# Patient Record
Sex: Female | Born: 1985 | Race: Black or African American | Hispanic: No | Marital: Single | Smoking: Never smoker
Health system: Southern US, Community
[De-identification: ages and names within clinical notes are randomized; demographics above are authoritative.]

## PROBLEM LIST (undated history)

## (undated) HISTORY — PX: TUBAL LIGATION: SHX77

---

## 2013-07-05 ENCOUNTER — Emergency Department (HOSPITAL_BASED_OUTPATIENT_CLINIC_OR_DEPARTMENT_OTHER): Payer: Worker's Compensation

## 2013-07-05 ENCOUNTER — Emergency Department (HOSPITAL_BASED_OUTPATIENT_CLINIC_OR_DEPARTMENT_OTHER)
Admission: EM | Admit: 2013-07-05 | Discharge: 2013-07-05 | Disposition: A | Discharge status: Home / Self Care | Payer: Worker's Compensation | Attending: Emergency Medicine | Admitting: Emergency Medicine

## 2013-07-05 ENCOUNTER — Encounter (HOSPITAL_BASED_OUTPATIENT_CLINIC_OR_DEPARTMENT_OTHER): Payer: Self-pay | Admitting: Emergency Medicine

## 2013-07-05 DIAGNOSIS — X500XXA Overexertion from strenuous movement or load, initial encounter: Secondary | ICD-10-CM | POA: Diagnosis not present

## 2013-07-05 DIAGNOSIS — IMO0002 Reserved for concepts with insufficient information to code with codable children: Secondary | ICD-10-CM | POA: Insufficient documentation

## 2013-07-05 DIAGNOSIS — Z3202 Encounter for pregnancy test, result negative: Secondary | ICD-10-CM | POA: Diagnosis not present

## 2013-07-05 DIAGNOSIS — S99919A Unspecified injury of unspecified ankle, initial encounter: Secondary | ICD-10-CM | POA: Diagnosis present

## 2013-07-05 DIAGNOSIS — S8990XA Unspecified injury of unspecified lower leg, initial encounter: Secondary | ICD-10-CM | POA: Diagnosis present

## 2013-07-05 DIAGNOSIS — Y9389 Activity, other specified: Secondary | ICD-10-CM | POA: Diagnosis not present

## 2013-07-05 DIAGNOSIS — S8392XA Sprain of unspecified site of left knee, initial encounter: Secondary | ICD-10-CM

## 2013-07-05 DIAGNOSIS — Y929 Unspecified place or not applicable: Secondary | ICD-10-CM | POA: Insufficient documentation

## 2013-07-05 DIAGNOSIS — Y990 Civilian activity done for income or pay: Secondary | ICD-10-CM | POA: Insufficient documentation

## 2013-07-05 LAB — PREGNANCY, URINE: Preg Test, Ur: NEGATIVE

## 2013-07-05 MED ORDER — IBUPROFEN 800 MG PO TABS
800.0000 mg | ORAL_TABLET | Freq: Once | ORAL | Status: AC
  Administered 2013-07-05: 800 mg via ORAL
  Filled 2013-07-05: qty 1

## 2013-07-05 NOTE — ED Notes (Signed)
Patient transported to X-ray 
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
## 2013-07-05 NOTE — ED Notes (Signed)
MD at bedside. 

## 2013-07-05 NOTE — ED Notes (Signed)
Patient asked to change into gown. 

## 2013-07-05 NOTE — Discharge Instructions (Signed)
Knee Sprain  A knee sprain is a tear in one of the strong, fibrous tissues that connect the bones (ligaments) in your knee. The severity of the sprain depends on how much of the ligament is torn. The tear can be either partial or complete.  CAUSES   Often, sprains are a result of a fall or injury. The force of the impact causes the fibers of your ligament to stretch too much. This excess tension causes the fibers of your ligament to tear.  SIGNS AND SYMPTOMS   You may have some loss of motion in your knee. Other symptoms include:   Bruising.   Pain in the knee area.   Tenderness of the knee to the touch.   Swelling.  DIAGNOSIS   To diagnose a knee sprain, your health care provider will physically examine your knee. Your health care provider may also suggest an X-Jash Wahlen exam of your knee to make sure no bones are broken.  TREATMENT   If your ligament is only partially torn, treatment usually involves keeping the knee in a fixed position (immobilization) or bracing your knee for activities that require movement for several weeks. To do this, your health care provider will apply a bandage, cast, or splint to keep your knee from moving and to support your knee during movement until it heals. For a partially torn ligament, the healing process usually takes 4 6 weeks.  If your ligament is completely torn, depending on which ligament it is, you may need surgery to reconnect the ligament to the bone or reconstruct it. After surgery, a cast or splint may be applied and will need to stay on your knee for 4 6 weeks while your ligament heals.  HOME CARE INSTRUCTIONS   Keep your injured knee elevated to decrease swelling.   To ease pain and swelling, apply ice to the injured area:   Put ice in a plastic bag.   Place a towel between your skin and the bag.   Leave the ice on for 20 minutes, 2 3 times a day.   Only take medicine for pain as directed by your health care provider.   Do not leave your knee unprotected until  pain and stiffness go away (usually 4 6 weeks).   If you have a cast or splint, do not allow it to get wet. If you have been instructed not to remove it, cover it with a plastic bag when you shower or bathe. Do not swim.   Your health care provider may suggest exercises for you to do during your recovery to prevent or limit permanent weakness and stiffness.  SEEK IMMEDIATE MEDICAL CARE IF:   Your cast or splint becomes damaged.   Your pain becomes worse.   You have significant pain, swelling, or numbness below the cast or splint.  MAKE SURE YOU:   Understand these instructions.   Will watch your condition.   Will get help right away if you are not doing well or get worse.  Document Released: 03/03/2005 Document Revised: 12/22/2012 Document Reviewed: 10/13/2012  [employer] Patient Information 2014 [employer].

## 2015-11-20 ENCOUNTER — Emergency Department (HOSPITAL_COMMUNITY)
Admission: EM | Admit: 2015-11-20 | Discharge: 2015-11-20 | Disposition: A | Discharge status: Home / Self Care | Payer: Medicaid Other | Attending: Emergency Medicine | Admitting: Emergency Medicine

## 2015-11-20 ENCOUNTER — Encounter (HOSPITAL_COMMUNITY): Payer: Self-pay | Admitting: Emergency Medicine

## 2015-11-20 DIAGNOSIS — R0789 Other chest pain: Secondary | ICD-10-CM | POA: Diagnosis not present

## 2015-11-20 DIAGNOSIS — F419 Anxiety disorder, unspecified: Secondary | ICD-10-CM

## 2015-11-20 DIAGNOSIS — R079 Chest pain, unspecified: Secondary | ICD-10-CM | POA: Diagnosis present

## 2015-11-20 LAB — CBC
HCT: 40.9 % (ref 36.0–46.0)
Hemoglobin: 13.1 g/dL (ref 12.0–15.0)
MCH: 26.1 pg (ref 26.0–34.0)
MCHC: 32 g/dL (ref 30.0–36.0)
MCV: 81.5 fL (ref 78.0–100.0)
Platelets: 160 10*3/uL (ref 150–400)
RBC: 5.02 MIL/uL (ref 3.87–5.11)
RDW: 14.7 % (ref 11.5–15.5)
WBC: 6.8 10*3/uL (ref 4.0–10.5)

## 2015-11-20 LAB — BASIC METABOLIC PANEL
Anion gap: 7 (ref 5–15)
BUN: 7 mg/dL (ref 6–20)
CO2: 20 mmol/L — ABNORMAL LOW (ref 22–32)
Calcium: 8.5 mg/dL — ABNORMAL LOW (ref 8.9–10.3)
Chloride: 110 mmol/L (ref 101–111)
Creatinine, Ser: 0.66 mg/dL (ref 0.44–1.00)
GFR calc Af Amer: >60 mL/min (ref >60)
GFR calc non Af Amer: >60 mL/min (ref >60)
Glucose, Bld: 99 mg/dL (ref 65–99)
Potassium: 3.7 mmol/L (ref 3.5–5.1)
Sodium: 137 mmol/L (ref 135–145)

## 2015-11-20 LAB — I-STAT BETA HCG BLOOD, ED (MC, WL, AP ONLY): I-stat hCG, quantitative: <5 m[IU]/mL (ref <5)

## 2015-11-20 MED ORDER — LORAZEPAM 2 MG/ML IJ SOLN
1.0000 mg | Freq: Once | INTRAMUSCULAR | Status: AC
  Administered 2015-11-20: 1 mg via INTRAVENOUS
  Filled 2015-11-20: qty 1

## 2015-11-20 MED ORDER — SODIUM CHLORIDE 0.9 % IV BOLUS (SEPSIS)
1000.0000 mL | Freq: Once | INTRAVENOUS | Status: AC
  Administered 2015-11-20: 1000 mL via INTRAVENOUS

## 2015-11-20 NOTE — Discharge Instructions (Signed)
Please rest for the remainder of the day. Return to the ED if your symptoms are worsening.
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
## 2015-11-20 NOTE — ED Triage Notes (Signed)
Per [facility] patient complains of squeezing chest pain that started at approximately 1445 today while riding to work.  Patient states this happened once before several years ago and it was related to anxiety and stress, but denies any significant stress factors at this time.  Patient received 324 asa and 1 SL nitro en route, patient states the nitro made her pain worse.  Patient is alert and oriented at this time.

## 2015-11-21 ENCOUNTER — Telehealth (HOSPITAL_BASED_OUTPATIENT_CLINIC_OR_DEPARTMENT_OTHER): Payer: Self-pay | Admitting: Emergency Medicine

## 2016-02-12 ENCOUNTER — Emergency Department (HOSPITAL_BASED_OUTPATIENT_CLINIC_OR_DEPARTMENT_OTHER)
Admission: EM | Admit: 2016-02-12 | Discharge: 2016-02-12 | Disposition: A | Discharge status: Home / Self Care | Payer: Medicaid Other | Attending: Emergency Medicine | Admitting: Emergency Medicine

## 2016-02-12 ENCOUNTER — Encounter (HOSPITAL_BASED_OUTPATIENT_CLINIC_OR_DEPARTMENT_OTHER): Payer: Self-pay | Admitting: *Deleted

## 2016-02-12 DIAGNOSIS — M545 Low back pain, unspecified: Secondary | ICD-10-CM

## 2016-02-12 MED ORDER — METHOCARBAMOL 500 MG PO TABS
1000.0000 mg | ORAL_TABLET | Freq: Once | ORAL | Status: AC
  Administered 2016-02-12: 1000 mg via ORAL
  Filled 2016-02-12: qty 2

## 2016-02-12 MED ORDER — OXYCODONE-ACETAMINOPHEN 5-325 MG PO TABS
1.0000 | ORAL_TABLET | Freq: Once | ORAL | Status: AC
  Administered 2016-02-12: 1 via ORAL
  Filled 2016-02-12: qty 1

## 2016-02-12 MED ORDER — METHOCARBAMOL 500 MG PO TABS: 500.0000 mg | ORAL_TABLET | Freq: Three times a day (TID) | ORAL | 0 refills | Status: DC | PRN

## 2016-02-12 MED ORDER — KETOROLAC TROMETHAMINE 60 MG/2ML IM SOLN
60.0000 mg | Freq: Once | INTRAMUSCULAR | Status: AC
  Administered 2016-02-12: 60 mg via INTRAMUSCULAR
  Filled 2016-02-12: qty 2

## 2016-02-12 MED ORDER — IBUPROFEN 400 MG PO TABS: 400.0000 mg | ORAL_TABLET | Freq: Three times a day (TID) | ORAL | 0 refills | Status: DC | PRN

## 2016-02-12 NOTE — ED Provider Notes (Addendum)
[facility]   CSN: [id number] Arrival date & time: 02/12/16  1252     History   Chief Complaint Chief Complaint  Patient presents with  . Back Pain    HPI Audrey Armstrong is a 30 y.o. female.  The history is provided by the patient.   Patient presents to the emergency department with complaints of worsening right low back pain today.  She did move a heavy TV several days ago but did not have immediate pain at that time.  She woke with severe pain in her right low back and stated difficulty getting out of bed secondary to the severe pain.  No nausea vomiting.  She states even rolling over causes her pain to be much worse.  No trauma.  No recent falls.  No fevers or chills.  No dysuria or urinary frequency.   History reviewed. No pertinent past medical history.  There are no active problems to display for this patient.   Past Surgical History:  Procedure Laterality Date  . TUBAL LIGATION      OB History    No data available       Home Medications    Prior to Admission medications   Medication Sig Start Date End Date Taking? Authorizing Provider  acetaminophen (TYLENOL) 325 MG tablet Take 650 mg by mouth every 6 (six) hours as needed.    Historical Provider, MD  ibuprofen (ADVIL,MOTRIN) 400 MG tablet Take 1 tablet (400 mg total) by mouth every 8 (eight) hours as needed. 02/12/16   Azalia BilisKevin Floella Ensz, MD  methocarbamol (ROBAXIN) 500 MG tablet Take 1 tablet (500 mg total) by mouth every 8 (eight) hours as needed for muscle spasms. 02/12/16   Azalia BilisKevin Cassundra Mckeever, MD    Family History No family history on file.  Social History Social History  Substance Use Topics  . Smoking status: Never Smoker  . Smokeless tobacco: Never Used  . Alcohol use No     Allergies   Patient has no known allergies.   Review of Systems Review of Systems  All other systems reviewed and are negative.    Physical Exam Updated Vital Signs BP 113/60 (BP Location:  Right Arm)   Pulse 78   Temp 98.4 F (36.9 C) (Oral)   Resp 18   Ht 5\' 5"  (1.651 m)   Wt 230 lb (104.3 kg)   LMP 12/16/2015 (Approximate)   SpO2 100%   BMI 38.27 kg/m   Physical Exam  Constitutional: She is oriented to person, place, and time. She appears well-developed and well-nourished.  HENT:  Head: Normocephalic.  Eyes: EOM are normal.  Neck: Normal range of motion.  Pulmonary/Chest: Effort normal.  Abdominal: She exhibits no distension.  Musculoskeletal: Normal range of motion.  No thoracic or lumbar point tenderness.  Right paralumbar tenderness and spasm noted.  No rash present  Neurological: She is alert and oriented to person, place, and time.  Full strength in bilateral lower extremity major muscle groups  Psychiatric: She has a normal mood and affect.  Nursing note and vitals reviewed.    ED Treatments / Results  Labs (all labs ordered are listed, but only abnormal results are displayed) Labs Reviewed - No data to display  EKG  EKG Interpretation None       Radiology No results found.  Procedures Procedures (including critical care time)  Medications Ordered in ED Medications  ketorolac (TORADOL) injection 60 mg (not administered)  methocarbamol (ROBAXIN) tablet 1,000 mg (not administered)  oxyCODONE-acetaminophen (PERCOCET/ROXICET) 5-325 MG per tablet 1 tablet (not administered)     Initial Impression / Assessment and Plan / ED Course  I have reviewed the triage vital signs and the nursing notes.  Pertinent labs & imaging results that were available during my care of the patient were reviewed by me and considered in my medical decision making (see chart for details).  Clinical Course     Normal lower extremity neurologic exam. No bowel or bladder complaints. No back pain red flags. Likely musculoskeletal back pain. Doubt spinal epidural abscess. Doubt cauda equina. Doubt pyelonephritis  Final Clinical Impressions(s) / ED Diagnoses    Final diagnoses:  Acute right-sided low back pain without sciatica    New Prescriptions New Prescriptions   IBUPROFEN (ADVIL,MOTRIN) 400 MG TABLET    Take 1 tablet (400 mg total) by mouth every 8 (eight) hours as needed.   METHOCARBAMOL (ROBAXIN) 500 MG TABLET    Take 1 tablet (500 mg total) by mouth every 8 (eight) hours as needed for muscle spasms.     Azalia BilisKevin Yari Szeliga, MD 02/12/16 1332    Azalia BilisKevin Senica Crall, MD 02/12/16 1332

## 2016-02-12 NOTE — ED Notes (Signed)
ED Provider at bedside. 

## 2016-02-12 NOTE — ED Triage Notes (Signed)
Per EMS report. Pt c/o back pain after moving large TV on Saturday. Pain worse today. VS: 142/90, HR 94, RR 22, 97% RA, Pain 10/10

## 2016-08-16 ENCOUNTER — Encounter (HOSPITAL_BASED_OUTPATIENT_CLINIC_OR_DEPARTMENT_OTHER): Payer: Self-pay | Admitting: *Deleted

## 2016-08-16 ENCOUNTER — Emergency Department (HOSPITAL_BASED_OUTPATIENT_CLINIC_OR_DEPARTMENT_OTHER)
Admission: EM | Admit: 2016-08-16 | Discharge: 2016-08-16 | Disposition: A | Discharge status: Home / Self Care | Payer: Medicaid Other | Attending: Emergency Medicine | Admitting: Emergency Medicine

## 2016-08-16 DIAGNOSIS — Z79899 Other long term (current) drug therapy: Secondary | ICD-10-CM | POA: Insufficient documentation

## 2016-08-16 DIAGNOSIS — J069 Acute upper respiratory infection, unspecified: Secondary | ICD-10-CM | POA: Insufficient documentation

## 2016-08-16 LAB — RAPID STREP SCREEN (MED CTR MEBANE ONLY): Streptococcus, Group A Screen (Direct): NEGATIVE

## 2016-08-16 NOTE — ED Triage Notes (Signed)
Pt reports nasal congestion, headache, L facial pain and sore throat since last night. Denies pain meds PTA.

## 2016-08-16 NOTE — ED Notes (Signed)
ED Provider at bedside. 

## 2016-08-16 NOTE — ED Provider Notes (Signed)
[facility]   CSN: [id number] Arrival date & time: 08/16/16  1329     History   Chief Complaint Chief Complaint  Patient presents with  . Nasal Congestion    HPI Audrey Armstrong is a 31 y.o. female.  HPI  Pt presenting with c/o nasal congestion, sore throat which started last night.  She also feels a sore in the left side of her mouth that is uncomfortable.  No fever.  No signfiicant cough.  She has a child at home with similar symptoms.  She has not had any treatment prior to arrival.  No difficulty breathing or swallowing, no vomiting or diarrhea.  There are no other associated systemic symptoms, there are no other alleviating or modifying factors.   History reviewed. No pertinent past medical history.  There are no active problems to display for this patient.   Past Surgical History:  Procedure Laterality Date  . TUBAL LIGATION      OB History    No data available       Home Medications    Prior to Admission medications   Medication Sig Start Date End Date Taking? Authorizing Provider  acetaminophen (TYLENOL) 325 MG tablet Take 650 mg by mouth every 6 (six) hours as needed.    [provider]  ibuprofen (ADVIL,MOTRIN) 400 MG tablet Take 1 tablet (400 mg total) by mouth every 8 (eight) hours as needed. 02/12/16   Azalia Bilisampos, Kevin, MD  methocarbamol (ROBAXIN) 500 MG tablet Take 1 tablet (500 mg total) by mouth every 8 (eight) hours as needed for muscle spasms. 02/12/16   Azalia Bilisampos, Kevin, MD    Family History No family history on file.  Social History Social History  Substance Use Topics  . Smoking status: Never Smoker  . Smokeless tobacco: Never Used  . Alcohol use No     Allergies   Patient has no known allergies.   Review of Systems Review of Systems  ROS reviewed and all otherwise negative except for mentioned in HPI   Physical Exam Updated Vital Signs BP (!) 105/54 (BP Location: Right Arm)   Pulse 68   Temp  98.3 F (36.8 C) (Oral)   Resp 16   Ht 5\' 5"  (1.651 m)   Wt 104.3 kg (230 lb)   LMP 08/13/2016 (Exact Date)   SpO2 100%   BMI 38.27 kg/m  Vitals reviewed Physical Exam Physical Examination: General appearance - alert, well appearing, and in no distress Mental status - alert, oriented to person, place, and time Eyes - no conjunctival injection, no scleral icterus Mouth - mucous membranes moist, pharynx normal without lesions, small aphthous ulcer on left buccal mucosa, mild erythema of OP, no exudate, palate symmetric, uvula midline Neck - supple, no significant adenopathy Chest - clear to auscultation, no wheezes, rales or rhonchi, symmetric air entry Heart - normal rate, regular rhythm, normal S1, S2, no murmurs, rubs, clicks or gallops Abdomen - soft, nontender, nondistended, no masses or organomegaly Neurological - alert, oriented, normal speech Extremities - peripheral pulses normal, no pedal edema, no clubbing or cyanosis Skin - normal coloration and turgor, no rashes  ED Treatments / Results  Labs (all labs ordered are listed, but only abnormal results are displayed) Labs Reviewed  [facility]APID ST[facility]EN [facility])  CULTURE, GROUP A STREP [facility]    EKG  EKG Interpretation None       Radiology No results found.  Procedures Procedures (including critical care time)  Medications Ordered in  ED Medications - No data to display   Initial Impression / Assessment and Plan / ED Course  I have reviewed the triage vital signs and the nursing notes.  Pertinent labs & imaging results that were available during my care of the patient were reviewed by me and considered in my medical decision making (see chart for details).    Pt presenting with c/o sore throat, nasal congestion.  Rapid strep is negative.   Patient is overall nontoxic and well hydrated in appearance.  Advised symptomatic care, push fluids.  Most c/w viral illness.  No signs of PTA, RP abscess, doubt  pneumonia.  Discharged with strict return precautions.  Pt agreeable with plan.   Final Clinical Impressions(s) / ED Diagnoses   Final diagnoses:  Viral URI    New Prescriptions Discharge Medication List as of 08/16/2016  3:44 PM       Jerelyn Scott, MD 08/17/16 [phone]

## 2016-08-16 NOTE — Discharge Instructions (Signed)
Return to the ED with any concerns including difficulty breathing, vomiting and not able to keep down liquids, decreased urine output, decreased level of alertness/lethargy, or any other alarming symptoms  °

## 2016-08-19 LAB — CULTURE, GROUP A STREP (THRC)

## 2016-10-03 ENCOUNTER — Emergency Department (HOSPITAL_BASED_OUTPATIENT_CLINIC_OR_DEPARTMENT_OTHER)
Admission: EM | Admit: 2016-10-03 | Discharge: 2016-10-03 | Disposition: A | Discharge status: Home / Self Care | Payer: Medicaid Other | Attending: Emergency Medicine | Admitting: Emergency Medicine

## 2016-10-03 ENCOUNTER — Encounter (HOSPITAL_BASED_OUTPATIENT_CLINIC_OR_DEPARTMENT_OTHER): Payer: Self-pay | Admitting: *Deleted

## 2016-10-03 DIAGNOSIS — R112 Nausea with vomiting, unspecified: Secondary | ICD-10-CM | POA: Insufficient documentation

## 2016-10-03 DIAGNOSIS — A599 Trichomoniasis, unspecified: Secondary | ICD-10-CM | POA: Insufficient documentation

## 2016-10-03 DIAGNOSIS — R197 Diarrhea, unspecified: Secondary | ICD-10-CM

## 2016-10-03 LAB — BASIC METABOLIC PANEL
Anion gap: 7 (ref 5–15)
BUN: 8 mg/dL (ref 6–20)
CO2: 27 mmol/L (ref 22–32)
Calcium: 8.4 mg/dL — ABNORMAL LOW (ref 8.9–10.3)
Chloride: 101 mmol/L (ref 101–111)
Creatinine, Ser: 0.82 mg/dL (ref 0.44–1.00)
GFR calc Af Amer: >60 mL/min (ref >60)
GFR calc non Af Amer: >60 mL/min (ref >60)
Glucose, Bld: 89 mg/dL (ref 65–99)
Potassium: 3.6 mmol/L (ref 3.5–5.1)
Sodium: 135 mmol/L (ref 135–145)

## 2016-10-03 LAB — CBC
HCT: 40.7 % (ref 36.0–46.0)
Hemoglobin: 13.4 g/dL (ref 12.0–15.0)
MCH: 27.2 pg (ref 26.0–34.0)
MCHC: 32.9 g/dL (ref 30.0–36.0)
MCV: 82.6 fL (ref 78.0–100.0)
Platelets: 141 10*3/uL — ABNORMAL LOW (ref 150–400)
RBC: 4.93 MIL/uL (ref 3.87–5.11)
RDW: 14.6 % (ref 11.5–15.5)
WBC: 8.3 10*3/uL (ref 4.0–10.5)

## 2016-10-03 LAB — PREGNANCY, URINE: Preg Test, Ur: NEGATIVE

## 2016-10-03 LAB — URINALYSIS, ROUTINE W REFLEX MICROSCOPIC
Bilirubin Urine: NEGATIVE
Glucose, UA: NEGATIVE mg/dL
Ketones, ur: 15 mg/dL — AB
Nitrite: NEGATIVE
Protein, ur: NEGATIVE mg/dL
Specific Gravity, Urine: 1.029 (ref 1.005–1.030)
pH: 5 (ref 5.0–8.0)

## 2016-10-03 LAB — URINALYSIS, MICROSCOPIC (REFLEX)

## 2016-10-03 MED ORDER — ONDANSETRON 4 MG PO TBDP: 4.0000 mg | ORAL_TABLET | Freq: Three times a day (TID) | ORAL | 0 refills | Status: DC | PRN

## 2016-10-03 MED ORDER — ONDANSETRON 4 MG PO TBDP
4.0000 mg | ORAL_TABLET | Freq: Once | ORAL | Status: AC
  Administered 2016-10-03: 4 mg via ORAL
  Filled 2016-10-03: qty 1

## 2016-10-03 MED ORDER — ONDANSETRON HCL 4 MG/2ML IJ SOLN: 4.0000 mg | Freq: Once | INTRAMUSCULAR | Status: DC

## 2016-10-03 MED ORDER — METRONIDAZOLE 500 MG PO TABS
2000.0000 mg | ORAL_TABLET | Freq: Once | ORAL | Status: AC
  Administered 2016-10-03: 2000 mg via ORAL
  Filled 2016-10-03: qty 4

## 2016-10-03 MED ORDER — SODIUM CHLORIDE 0.9 % IV BOLUS (SEPSIS)
1000.0000 mL | Freq: Once | INTRAVENOUS | Status: AC
  Administered 2016-10-03: 1000 mL via INTRAVENOUS

## 2016-10-03 NOTE — ED Notes (Signed)
Pt verbalizes understanding of d/c instructions and denies any further needs at this time. 

## 2016-10-03 NOTE — ED Notes (Signed)
Patient given zofran for PO challenge

## 2016-10-03 NOTE — ED Provider Notes (Signed)
[facility] Provider Note   CSN: [id number] Arrival date & time: 10/03/16  1755 By signing my name below, I, Levon Hedger, attest that this documentation has been prepared under the direction and in the presence of Jerelyn Scott, MD . Electronically Signed: Levon Hedger, Scribe. 10/03/2016. 7:10 PM.   History   Chief Complaint Chief Complaint  Patient presents with  . Emesis   HPI Audrey Armstrong is a 31 y.o. female who presents to the Emergency Department complaining of intermittent non-bloody, non-bilious emesis onset yesterday. Pt reports having 4 episodes of vomiting today. She reports associated nausea, diarrhea and sharp RUQ abdominal pain. Pt has tried to eat ginger ale and crackers today but is unable to keep anything down. No OTC treatments tried for these symptoms PTA. No known sick contacts. No recent travel. She denies any fever and has no other complaints at this time.   The history is provided by the patient. No language interpreter was used.   History reviewed. No pertinent past medical history.  There are no active problems to display for this patient.  Past Surgical History:  Procedure Laterality Date  . TUBAL LIGATION      OB History    No data available      Home Medications    Prior to Admission medications   Medication Sig Start Date End Date Taking? Authorizing Provider  acetaminophen (TYLENOL) 325 MG tablet Take 650 mg by mouth every 6 (six) hours as needed.    [provider]  ibuprofen (ADVIL,MOTRIN) 400 MG tablet Take 1 tablet (400 mg total) by mouth every 8 (eight) hours as needed. 02/12/16   Azalia Bilis, MD  ondansetron (ZOFRAN ODT) 4 MG disintegrating tablet Take 1 tablet (4 mg total) by mouth every 8 (eight) hours as needed for nausea or vomiting. 10/03/16   Jerelyn Scott, MD    Family History History reviewed. No pertinent family history.  Social History Social History  Substance Use Topics  . Smoking status:  Never Smoker  . Smokeless tobacco: Never Used  . Alcohol use No    Allergies   Patient has no known allergies.   Review of Systems Review of Systems  Constitutional: Negative for fever.  Gastrointestinal: Positive for abdominal pain, diarrhea, nausea and vomiting.  All other systems reviewed and are negative.  Physical Exam Updated Vital Signs BP 127/71 (BP Location: Left Arm)   Pulse 80   Temp 98.7 F (37.1 C) (Oral)   Resp 18   Ht 5\' 5"  (1.651 m)   Wt 104.3 kg (230 lb)   LMP 09/09/2016   SpO2 100%   BMI 38.27 kg/m  Vitals reviewed Physical Exam  Physical Examination: General appearance - alert, well appearing, and in no distress Mental status - alert, oriented to person, place, and time Eyes - no conjunctival injection, no scleral icterus Mouth - mucous membranes moist, pharynx normal without lesions Neck - supple, no significant adenopathy Chest - clear to auscultation, no wheezes, rales or rhonchi, symmetric air entry Heart - normal rate, regular rhythm, normal S1, S2, no murmurs, rubs, clicks or gallops Abdomen - soft, mild epigastric tenderness, no gaurding or rebound tenderness, no lower abdominal tenderness to palpation, nondistended, no masses or organomegaly Neurological - alert, oriented, normal speech Extremities - peripheral pulses normal, no pedal edema, no clubbing or cyanosis Skin - normal coloration and turgor, no rashes  ED Treatments / Results  DIAGNOSTIC STUDIES:  Oxygen Saturation is 100% on RA, normal by my interpretation.  COORDINATION OF CARE:  7:12 PM Discussed treatment plan which includes Zofran and IV fluids with pt at bedside and pt agreed to plan.   Labs (all labs ordered are listed, but only abnormal results are displayed) Labs Reviewed  URINALYSIS, ROUTINE W REFLEX MICROSCOPIC - Abnormal; Notable for the following:       Result Value   APPearance CLOUDY (*)    Hgb urine dipstick LARGE (*)    Ketones, ur 15 (*)    Leukocytes,  UA LARGE (*)    All other components within normal limits  URINALYSIS, MICROSCOPIC (REFLEX) - Abnormal; Notable for the following:    Bacteria, UA MANY (*)    Squamous Epithelial / LPF 6-30 (*)    All other components within normal limits  CBC - Abnormal; Notable for the following:    Platelets 141 (*)    All other components within normal limits  BASIC METABOLIC PANEL - Abnormal; Notable for the following:    Calcium 8.4 (*)    All other components within normal limits  PREGNANCY, URINE  GC/CHLAMYDIA PROBE AMP ([place]    EKG  EKG Interpretation None       Radiology No results found.  Procedures Procedures (including critical care time)  Medications Ordered in ED Medications  sodium chloride 0.9 % bolus 1,000 mL (0 mLs Intravenous Stopped 10/03/16 2039)  ondansetron (ZOFRAN-ODT) disintegrating tablet 4 mg (4 mg Oral Given 10/03/16 1919)  metroNIDAZOLE (FLAGYL) tablet 2,000 mg (2,000 mg Oral Given 10/03/16 2038)     Initial Impression / Assessment and Plan / ED Course  I have reviewed the triage vital signs and the nursing notes.  Pertinent labs & imaging results that were available during my care of the patient were reviewed by me and considered in my medical decision making (see chart for details).     Pt presenting with c/o vomtiing and diarrhea.  Abdominal exam is benign without significant tenderness.  Labs are reassuring. Pt feels improved after zofran and is able to tolerate po fluids.  Pt does have trichomonas in urine- treated with po flagyl.  No abdominal tendereness to suggest PID,  Urine sent for gc/chlamydia as well.  Pt counseled to alert sexual partners that they should be treated.  Discharged with strict return precautions.  Pt agreeable with plan.  Final Clinical Impressions(s) / ED Diagnoses   Final diagnoses:  Nausea vomiting and diarrhea  Trichomonas infection    New Prescriptions Discharge Medication List as of 10/03/2016  8:50  PM    START taking these medications   Details  ondansetron (ZOFRAN ODT) 4 MG disintegrating tablet Take 1 tablet (4 mg total) by mouth every 8 (eight) hours as needed for nausea or vomiting., Starting Fri 10/03/2016, Print       I personally performed the services described in this documentation, which was scribed in my presence. The recorded information has been reviewed and is accurate.     Jerelyn ScottLinker, Martha, MD 10/04/16 [id number]

## 2016-10-03 NOTE — ED Triage Notes (Signed)
Pt c/o n/v/d and dizziness x 2 days

## 2016-10-03 NOTE — ED Notes (Signed)
Patient reports relief from nausea

## 2016-10-03 NOTE — Discharge Instructions (Signed)
Return to the ED with any concerns including vomiting and not able to keep down liquids or your medications, abdominal pain especially if it localizes to the right lower abdomen, fever or chills, and decreased urine output, decreased level of alertness or lethargy, or any other alarming symptoms.   It is very important that any sexual partners are notified and treated for trichomonas as well

## 2016-10-06 LAB — GC/CHLAMYDIA PROBE AMP (~~LOC~~) NOT AT ARMC

## 2017-07-14 ENCOUNTER — Other Ambulatory Visit: Payer: Self-pay

## 2017-07-14 ENCOUNTER — Emergency Department (HOSPITAL_BASED_OUTPATIENT_CLINIC_OR_DEPARTMENT_OTHER)
Admission: EM | Admit: 2017-07-14 | Discharge: 2017-07-14 | Disposition: A | Discharge status: Home / Self Care | Payer: Self-pay | Attending: Emergency Medicine | Admitting: Emergency Medicine

## 2017-07-14 ENCOUNTER — Encounter (HOSPITAL_BASED_OUTPATIENT_CLINIC_OR_DEPARTMENT_OTHER): Payer: Self-pay | Admitting: Emergency Medicine

## 2017-07-14 DIAGNOSIS — Z5321 Procedure and treatment not carried out due to patient leaving prior to being seen by health care provider: Secondary | ICD-10-CM | POA: Insufficient documentation

## 2017-07-14 DIAGNOSIS — M549 Dorsalgia, unspecified: Secondary | ICD-10-CM | POA: Insufficient documentation

## 2017-07-14 DIAGNOSIS — R1030 Lower abdominal pain, unspecified: Secondary | ICD-10-CM | POA: Insufficient documentation

## 2017-07-14 LAB — URINALYSIS, ROUTINE W REFLEX MICROSCOPIC
Bilirubin Urine: NEGATIVE
Glucose, UA: NEGATIVE mg/dL
Ketones, ur: NEGATIVE mg/dL
Nitrite: NEGATIVE
Protein, ur: NEGATIVE mg/dL
Specific Gravity, Urine: >1.03 — ABNORMAL HIGH (ref 1.005–1.030)
pH: 5.5 (ref 5.0–8.0)

## 2017-07-14 LAB — URINALYSIS, MICROSCOPIC (REFLEX)

## 2017-07-14 LAB — PREGNANCY, URINE: Preg Test, Ur: NEGATIVE

## 2017-07-14 NOTE — ED Notes (Signed)
Pt informed registration that she had another appointment that she had to go to and was leaving.

## 2017-07-14 NOTE — ED Triage Notes (Signed)
Pt c/o lower abd and back pain; sts she was just treated for a "kidney infection" over Easter.

## 2019-03-02 ENCOUNTER — Emergency Department (HOSPITAL_COMMUNITY)
Admission: EM | Admit: 2019-03-02 | Discharge: 2019-03-02 | Disposition: A | Discharge status: Home / Self Care | Payer: Medicaid Other | Attending: Emergency Medicine | Admitting: Emergency Medicine

## 2019-03-02 ENCOUNTER — Other Ambulatory Visit: Payer: Self-pay

## 2019-03-02 ENCOUNTER — Encounter (HOSPITAL_COMMUNITY): Payer: Self-pay

## 2019-03-02 DIAGNOSIS — U071 COVID-19: Secondary | ICD-10-CM | POA: Diagnosis not present

## 2019-03-02 DIAGNOSIS — R432 Parageusia: Secondary | ICD-10-CM

## 2019-03-02 DIAGNOSIS — R05 Cough: Secondary | ICD-10-CM | POA: Diagnosis present

## 2019-03-02 DIAGNOSIS — R059 Cough, unspecified: Secondary | ICD-10-CM

## 2019-03-02 LAB — SARS CORONAVIRUS 2 (TAT 6-24 HRS): SARS Coronavirus 2: POSITIVE — AB

## 2019-03-02 MED ORDER — ACETAMINOPHEN 500 MG PO TABS: 500.0000 mg | ORAL_TABLET | Freq: Four times a day (QID) | ORAL | 0 refills | Status: DC | PRN

## 2019-03-02 MED ORDER — ACETAMINOPHEN 500 MG PO TABS: 500.0000 mg | ORAL_TABLET | Freq: Four times a day (QID) | ORAL | 0 refills | Status: AC | PRN

## 2019-03-02 MED ORDER — NAPROXEN 500 MG PO TABS: 500.0000 mg | ORAL_TABLET | Freq: Two times a day (BID) | ORAL | 0 refills | Status: DC | PRN

## 2019-03-02 NOTE — ED Provider Notes (Signed)
[facility] DEPT Provider Note   CSN: [id number] Arrival date & time: 03/02/19  1426     History Chief Complaint  Patient presents with  . PUI COVID+  . Unable to taste    Audrey Armstrong is a 33 y.o. female with no PMH presents to the ED with 2-day history of mild cough, intermittent chills, and loss of smell and taste.  Patient reports that she works at [employer] here at [employer].  She lives at home with her husband, 3 kids, and brother.  Her husband was recently sick with upper respiratory infection symptoms and she believes that she got sick from taking care of him.  This morning she tried to reports that she tried vinegar which tasted like water and she was unable to smell bleach.  She drove around to various COVID-19 testing center today, but was unable to secure testing without an appointment which prompted her to come to the ED.  She denies any fevers, difficulty breathing, chest discomfort, body aches, dizziness, abdominal pains, nausea or vomiting, diminished appetite, urinary symptoms, or changes in bowel habits.  Past surgical history significant for tubal ligation.  She has not taken any for symptoms.    HPI     History reviewed. No pertinent past medical history.  There are no problems to display for this patient.   Past Surgical History:  Procedure Laterality Date  . TUBAL LIGATION       OB History   No obstetric history on file.     No family history on file.  Social History   Tobacco Use  . Smoking status: Never Smoker  . Smokeless tobacco: Never Used  Substance Use Topics  . Alcohol use: No  . Drug use: No    Home Medications Prior to Admission medications   Medication Sig Start Date End Date Taking? Authorizing Provider  acetaminophen (TYLENOL) 500 MG tablet Take 1 tablet (500 mg total) by mouth every 6 (six) hours as needed for fever (chills, body aches). 03/02/19   Corena Herter, PA-C   ibuprofen (ADVIL,MOTRIN) 400 MG tablet Take 1 tablet (400 mg total) by mouth every 8 (eight) hours as needed. 02/12/16   Jola Schmidt, MD  naproxen (NAPROSYN) 500 MG tablet Take 1 tablet (500 mg total) by mouth 2 (two) times daily between meals as needed for moderate pain (fever, chills). 03/02/19   Corena Herter, PA-C  ondansetron (ZOFRAN ODT) 4 MG disintegrating tablet Take 1 tablet (4 mg total) by mouth every 8 (eight) hours as needed for nausea or vomiting. 10/03/16   Mabe, Forbes Cellar, MD    Allergies    Patient has no known allergies.  Review of Systems   Review of Systems  Constitutional: Positive for chills. Negative for diaphoresis and fever.  Respiratory: Positive for cough. Negative for shortness of breath.   Cardiovascular: Negative for chest pain and leg swelling.  Gastrointestinal: Negative for abdominal pain, diarrhea, nausea and vomiting.  All other systems reviewed and are negative.   Physical Exam Updated Vital Signs BP 129/79 (BP Location: Left Arm)   Pulse 87   Temp 98.3 F (36.8 C) (Oral)   Resp 17   Ht 5\' 5"  (1.651 m)   Wt 104.3 kg   LMP 03/02/2019 (Exact Date)   SpO2 100%   BMI 38.26 kg/m   Physical Exam Vitals and nursing note reviewed. Exam conducted with a chaperone present.  Constitutional:      Appearance: Normal appearance.  Comments: Resting comfortably.  HENT:     Head: Normocephalic and atraumatic.     Mouth/Throat:     Pharynx: Oropharynx is clear.  Eyes:     General: No scleral icterus.    Conjunctiva/sclera: Conjunctivae normal.  Cardiovascular:     Rate and Rhythm: Normal rate and regular rhythm.     Pulses: Normal pulses.     Heart sounds: Normal heart sounds.  Pulmonary:     Effort: Pulmonary effort is normal. No respiratory distress.     Breath sounds: Normal breath sounds. No wheezing or rales.  Abdominal:     General: Abdomen is flat. There is no distension.     Palpations: Abdomen is soft.     Tenderness: There is no  abdominal tenderness. There is no guarding.  Musculoskeletal:     Cervical back: Normal range of motion and neck supple.  Skin:    General: Skin is dry.  Neurological:     Mental Status: She is alert and oriented to person, place, and time.     GCS: GCS eye subscore is 4. GCS verbal subscore is 5. GCS motor subscore is 6.  Psychiatric:        Mood and Affect: Mood normal.        Behavior: Behavior normal.        Thought Content: Thought content normal.     ED Results / Procedures / Treatments   Labs (all labs ordered are listed, but only abnormal results are displayed) Labs Reviewed  SARS CORONAVIRUS 2 (TAT 6-24 HRS)    EKG None  Radiology No results found.  Procedures Procedures (including critical care time)  Medications Ordered in ED Medications - No data to display  ED Course  I have reviewed the triage vital signs and the nursing notes.  Pertinent labs & imaging results that were available during my care of the patient were reviewed by me and considered in my medical decision making (see chart for details).    MDM Rules/Calculators/A&P                      Patient presents to the ED with history and physical exam concerning for COVID-19 infection.  Will obtain 6 to 24-hour testing.  Instructed patient to isolate at home to prevent spreading infection to her children and brother who are at this time asymptomatic.  Patient has not taken anything for her symptoms, advised her to consider Delsym over-the-counter medication for her cough.  Also encouraged her to try cough drops and warm tea with honey.  Will prescribe her naproxen 500 mg as needed for fevers, chills, and body aches.  She adamantly denies possibility of pregnancy due to tubal ligation.  Instructed her to discontinue the naproxen should that change.  She may also take Tylenol in addition to the naproxen for symptomatic relief.   Strict return precautions discussed including but not limited to inability to  eat or drink, uncontrolled nausea or vomiting, fevers or chills uncontrolled by Tylenol and ibuprofen, difficulty breathing, or any other new or worsening symptoms. All of the evaluation and work-up results were discussed with the patient and any family at bedside. They were provided opportunity to ask any additional questions and have none at this time. They have expressed understanding of verbal discharge instructions as well as return precautions and are agreeable to the plan.   Audrey Armstrong was evaluated in Emergency Department on 03/02/2019 for the symptoms described in the history of present  illness. She was evaluated in the context of the global COVID-19 pandemic, which necessitated consideration that the patient might be at risk for infection with the SARS-CoV-2 virus that causes COVID-19. Institutional protocols and algorithms that pertain to the evaluation of patients at risk for COVID-19 are in a state of rapid change based on information released by regulatory bodies including the CDC and federal and state organizations. These policies and algorithms were followed during the patient's care in the ED.   Final Clinical Impression(s) / ED Diagnoses Final diagnoses:  Loss of taste  Cough    Rx / DC Orders ED Discharge Orders         Ordered    naproxen (NAPROSYN) 500 MG tablet  2 times daily between meals PRN     03/02/19 1528    acetaminophen (TYLENOL) 500 MG tablet  Every 6 hours PRN     03/02/19 1528           Elvera MariaGreen, Damali Broadfoot L, PA-C 03/02/19 1531    Raeford RazorKohut, Stephen, MD 03/02/19 1836

## 2019-03-02 NOTE — ED Triage Notes (Signed)
Patient arrived via POV from home. Patient is AOx4 and ambulatory. Patient stated last night, patient was eating peanuts and was unable to taste anything. Patient today tried tasting vinegar this morning and was unable to taste vinegar. Patient is on room air, no complaints of chest pain or SOB. Patient is Afebrile. Patient is concerned and wants to be tested.   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

## 2019-03-02 NOTE — Discharge Instructions (Signed)
Please take your medications, as prescribed. I cannot emphasize enough the importance of increased oral hydration in the setting of fevers and chills.  Please review the attachment of COVID-19 discharge instructions and adhere to isolation guidelines and precautions.  Please return to the ED after medical attention should you develop any inability to eat or drink, uncontrolled nausea or vomiting, fevers or chills uncontrolled with Tylenol or naproxen, difficulty breathing, or any other new or worsening symptoms.

## 2020-03-31 ENCOUNTER — Emergency Department (HOSPITAL_BASED_OUTPATIENT_CLINIC_OR_DEPARTMENT_OTHER): Payer: Medicaid Other

## 2020-03-31 ENCOUNTER — Other Ambulatory Visit: Payer: Self-pay

## 2020-03-31 ENCOUNTER — Emergency Department (HOSPITAL_BASED_OUTPATIENT_CLINIC_OR_DEPARTMENT_OTHER)
Admission: EM | Admit: 2020-03-31 | Discharge: 2020-03-31 | Disposition: A | Discharge status: Home / Self Care | Payer: Medicaid Other | Attending: Emergency Medicine | Admitting: Emergency Medicine

## 2020-03-31 ENCOUNTER — Ambulatory Visit
Admission: EM | Admit: 2020-03-31 | Discharge: 2020-03-31 | Disposition: A | Discharge status: Home / Self Care | Payer: Medicaid Other | Attending: Emergency Medicine | Admitting: Emergency Medicine

## 2020-03-31 ENCOUNTER — Encounter (HOSPITAL_BASED_OUTPATIENT_CLINIC_OR_DEPARTMENT_OTHER): Payer: Self-pay | Admitting: Emergency Medicine

## 2020-03-31 ENCOUNTER — Encounter: Payer: Self-pay | Admitting: Emergency Medicine

## 2020-03-31 DIAGNOSIS — Z20822 Contact with and (suspected) exposure to covid-19: Secondary | ICD-10-CM

## 2020-03-31 DIAGNOSIS — M7989 Other specified soft tissue disorders: Secondary | ICD-10-CM

## 2020-03-31 DIAGNOSIS — L03116 Cellulitis of left lower limb: Secondary | ICD-10-CM | POA: Diagnosis not present

## 2020-03-31 DIAGNOSIS — M79605 Pain in left leg: Secondary | ICD-10-CM

## 2020-03-31 DIAGNOSIS — R030 Elevated blood-pressure reading, without diagnosis of hypertension: Secondary | ICD-10-CM | POA: Insufficient documentation

## 2020-03-31 DIAGNOSIS — R2242 Localized swelling, mass and lump, left lower limb: Secondary | ICD-10-CM | POA: Diagnosis present

## 2020-03-31 MED ORDER — SULFAMETHOXAZOLE-TRIMETHOPRIM 800-160 MG PO TABS: 1.0000 | ORAL_TABLET | Freq: Two times a day (BID) | ORAL | 0 refills | Status: AC

## 2020-03-31 MED ORDER — CEFTRIAXONE SODIUM 1 G IJ SOLR
1.0000 g | Freq: Once | INTRAMUSCULAR | Status: AC
  Administered 2020-03-31: 1 g via INTRAMUSCULAR
  Filled 2020-03-31: qty 10

## 2020-03-31 MED ORDER — LIDOCAINE HCL (PF) 1 % IJ SOLN
INTRAMUSCULAR | Status: AC
  Administered 2020-03-31: 1.2 mL
  Filled 2020-03-31: qty 5

## 2020-03-31 NOTE — Discharge Instructions (Signed)
Take the antibiotics as prescribed.  May take Tylenol or Ibuprofen as needed for pain  Return for new or worsening symptoms.

## 2020-03-31 NOTE — ED Notes (Signed)
In to round on client, visualized left leg area where pain complaint is located, noted area to be very red in color, tender to touch, also very warm to touch. States began noting area approx 1 week ago. When attempting to ambulate pain increases.

## 2020-03-31 NOTE — ED Triage Notes (Signed)
L calf pain and swelling x 1 week. Sent from UC.

## 2020-03-31 NOTE — ED Provider Notes (Signed)
[facility]    CSN: [id number] Arrival date & time: 03/31/20  0908      History   Chief Complaint Chief Complaint  Patient presents with  . Leg Pain  . Leg Swelling    LT    HPI Audrey Armstrong is a 35 y.o. female  With a history of obesity presenting for left anterior leg pain, swelling.  States Monday she woke up with a bruise and has had worsening pain, swelling since.  States bruise has resolved, though there is now a red spot that is warm to touch.  Suddenly worse this AM.  Denies history of immunocompromise state, malignancy, DVT/PE.  Does have remote history of COVID.  States that she did have severe migraines with emesis on Monday which is since resolved.  Does have general malaise, nasal congestion.  No known sick contacts.  History reviewed. No pertinent past medical history.  There are no problems to display for this patient.   Past Surgical History:  Procedure Laterality Date  . TUBAL LIGATION      OB History   No obstetric history on file.      Home Medications    Prior to Admission medications   Medication Sig Start Date End Date Taking? Authorizing Provider  acetaminophen (TYLENOL) 500 MG tablet Take 1 tablet (500 mg total) by mouth every 6 (six) hours as needed for fever (chills, body aches). 03/02/19   Lorelee New, PA-C  ibuprofen (ADVIL,MOTRIN) 400 MG tablet Take 1 tablet (400 mg total) by mouth every 8 (eight) hours as needed. 02/12/16   Azalia Bilis, MD  naproxen (NAPROSYN) 500 MG tablet Take 1 tablet (500 mg total) by mouth 2 (two) times daily between meals as needed for moderate pain (fever, chills). 03/02/19   Lorelee New, PA-C  ondansetron (ZOFRAN ODT) 4 MG disintegrating tablet Take 1 tablet (4 mg total) by mouth every 8 (eight) hours as needed for nausea or vomiting. 10/03/16   Mabe, Latanya Maudlin, MD    Family History History reviewed. No pertinent family history.  Social History Social History   Tobacco Use  .  Smoking status: Never Smoker  . Smokeless tobacco: Never Used  Vaping Use  . Vaping Use: Never used  Substance Use Topics  . Alcohol use: No  . Drug use: No     Allergies   Patient has no known allergies.   Review of Systems Review of Systems  Constitutional: Negative for fatigue and fever.  HENT: Negative for ear pain, sinus pain, sore throat and voice change.   Eyes: Negative for pain, redness and visual disturbance.  Respiratory: Negative for cough and shortness of breath.   Cardiovascular: Negative for chest pain, palpitations and leg swelling.  Gastrointestinal: Negative for abdominal pain, diarrhea and vomiting.  Musculoskeletal:       Left leg pain, swelling  Skin: Negative for rash and wound.  Neurological: Negative for syncope and headaches.     Physical Exam Triage Vital Signs ED Triage Vitals  Enc Vitals Group     BP 03/31/20 1036 121/80     Pulse Rate 03/31/20 1036 73     Resp 03/31/20 1036 18     Temp 03/31/20 1036 98.1 F (36.7 C)     Temp Source 03/31/20 1036 Oral     SpO2 03/31/20 1036 96 %     Weight --      Height --      Head Circumference --  Peak Flow --      Pain Score 03/31/20 0952 7     Pain Loc --      Pain Edu? --      Excl. in GC? --    No data found.  Updated Vital Signs BP 121/80 (BP Location: Right Arm)   Pulse 73   Temp 98.1 F (36.7 C) (Oral)   Resp 18   LMP 03/29/2020   SpO2 96%   Visual Acuity Right Eye Distance:   Left Eye Distance:   Bilateral Distance:    Right Eye Near:   Left Eye Near:    Bilateral Near:     Physical Exam Constitutional:      General: She is not in acute distress. HENT:     Head: Normocephalic and atraumatic.  Eyes:     General: No scleral icterus.    Pupils: Pupils are equal, round, and reactive to light.  Cardiovascular:     Rate and Rhythm: Normal rate.  Pulmonary:     Effort: Pulmonary effort is normal.  Musculoskeletal:        General: Swelling and tenderness present.      Comments: Left anterior leg with significant pain.  There is a large area of erythema with warmth.  No open wound, discharge, crepitus.  Patient does have pitting edema in foot.  NVI.  No cords palpated, negative Homans' sign. Right leg and foot unremarkable.  Skin:    General: Skin is warm.     Capillary Refill: Capillary refill takes less than 2 seconds.     Coloration: Skin is not jaundiced or pale.     Findings: Erythema present. No bruising or rash.  Neurological:     General: No focal deficit present.     Mental Status: She is alert and oriented to person, place, and time.      UC Treatments / Results  Labs (all labs ordered are listed, but only abnormal results are displayed) Labs Reviewed  NOVEL CORONAVIRUS, NAA    EKG   Radiology No results found.  Procedures Procedures (including critical care time)  Medications Ordered in UC Medications - No data to display  Initial Impression / Assessment and Plan / UC Course  I have reviewed the triage vital signs and the nursing notes.  Pertinent labs & imaging results that were available during my care of the patient were reviewed by me and considered in my medical decision making (see chart for details).     Patient febrile, nontoxic in office today.  No respiratory distress, tachypnea, hypoxia.  Patient denies trauma to affected area.  Is concern for brewing cellulitis, though patient is without immunocompromise state and onset is quite sudden.  Patient did have severe migraines Monday with general malaise, congestion.  COVID test pending: Discussed this does create a hypercoagulable state.  Also discussed differentials including gout and pseudogout and foot that is causing sequela in proximal leg.  Referred patient to ER for ultrasound/DVT rule out.  Into transport in personal vehicle in stable condition.  Return precautions discussed, pt verbalized understanding and is agreeable to plan. Final Clinical Impressions(s) / UC  Diagnoses   Final diagnoses:  Acute leg pain, left  Left leg swelling  Encounter for laboratory testing for COVID-19 virus   Discharge Instructions   None    ED Prescriptions    None     PDMP not reviewed this encounter.   Hall-Potvin, [place], [place] 03/31/20 1256

## 2020-03-31 NOTE — ED Provider Notes (Signed)
[facility] EMERGENCY DEPARTMENT Provider Note   CSN: [id number] Arrival date & time: 03/31/20  1210    History Chief Complaint  Patient presents with  . Leg Swelling    Audrey Armstrong is a 35 y.o. female with no past medical history who presents for evaluation of leg pain.  Patient states 5 days ago she woke up with a slightly ecchymotic area to her left anterior leg.  States this resolved.  She has now had an increasingly warm, red spot that is warm to the touch.  States this worsened this morning.  No history of PE, DVT, recent surgery, immobilization, malignancy, exogenous hormone use.  She denies fever, chills, nausea, vomiting, chest pain, shortness of breath abdominal pain, diarrhea, dysuria.  Denies any recent COVID exposures.  Denies additional aggravating or alleviating factors. No injury or trauma. Ambulatory without difficulty. Pain a 5/10.  History obtained from patient and past medical records.  No interpretor was used.  HPI     History reviewed. No pertinent past medical history.  There are no problems to display for this patient.   Past Surgical History:  Procedure Laterality Date  . TUBAL LIGATION       OB History   No obstetric history on file.     No family history on file.  Social History   Tobacco Use  . Smoking status: Never Smoker  . Smokeless tobacco: Never Used  Vaping Use  . Vaping Use: Never used  Substance Use Topics  . Alcohol use: No  . Drug use: No    Home Medications Prior to Admission medications   Medication Sig Start Date End Date Taking? Authorizing Provider  sulfamethoxazole-trimethoprim (BACTRIM DS) 800-160 MG tablet Take 1 tablet by mouth 2 (two) times daily for 7 days. 03/31/20 04/07/20 Yes Kelvin Burpee A, PA-C  acetaminophen (TYLENOL) 500 MG tablet Take 1 tablet (500 mg total) by mouth every 6 (six) hours as needed for fever (chills, body aches). 03/02/19   Lorelee New, PA-C  ibuprofen (ADVIL,MOTRIN)  400 MG tablet Take 1 tablet (400 mg total) by mouth every 8 (eight) hours as needed. 02/12/16   Azalia Bilis, MD  naproxen (NAPROSYN) 500 MG tablet Take 1 tablet (500 mg total) by mouth 2 (two) times daily between meals as needed for moderate pain (fever, chills). 03/02/19   Lorelee New, PA-C  ondansetron (ZOFRAN ODT) 4 MG disintegrating tablet Take 1 tablet (4 mg total) by mouth every 8 (eight) hours as needed for nausea or vomiting. 10/03/16   Mabe, Latanya Maudlin, MD    Allergies    Patient has no known allergies.  Review of Systems   Review of Systems  Constitutional: Negative.   HENT: Negative.   Respiratory: Negative.   Cardiovascular: Negative.   Gastrointestinal: Negative.   Genitourinary: Negative.   Musculoskeletal:       Left anterior leg redness and pain  Skin: Positive for wound.  Neurological: Negative.   All other systems reviewed and are negative.   Physical Exam Updated Vital Signs BP 136/85 (BP Location: Right Arm)   Pulse 80   Temp 99.1 F (37.3 C) (Oral)   Resp 18   Ht 5\' 5"  (1.651 m)   Wt 115.7 kg   LMP 03/26/2020   SpO2 98%   BMI 42.43 kg/m   Physical Exam Vitals and nursing note reviewed.  Constitutional:      General: She is not in acute distress.    Appearance: She is well-developed and  well-nourished. She is not ill-appearing, toxic-appearing or diaphoretic.  HENT:     Head: Normocephalic and atraumatic.     Nose: Nose normal.     Mouth/Throat:     Mouth: Mucous membranes are moist.  Eyes:     Pupils: Pupils are equal, round, and reactive to light.  Cardiovascular:     Rate and Rhythm: Normal rate.     Pulses: Normal pulses and intact distal pulses.     Heart sounds: Normal heart sounds.  Pulmonary:     Effort: Pulmonary effort is normal. No respiratory distress.     Breath sounds: Normal breath sounds.  Abdominal:     General: Bowel sounds are normal. There is no distension.     Palpations: There is no mass.     Tenderness: There is  no abdominal tenderness. There is no right CVA tenderness, left CVA tenderness, guarding or rebound.     Hernia: No hernia is present.  Musculoskeletal:        General: Normal range of motion.     Cervical back: Normal range of motion.       Legs:     Comments: Moves all 4 extremities out difficulty.  No bony tenderness.  Compartment soft. Homans sign negative  Skin:    General: Skin is warm and dry.     Capillary Refill: Capillary refill takes less than 2 seconds.     Comments: 8 cm of erythema, warmth and tenderness to anterior left shin.  No bony tenderness, crepitus.  No vesicles, bulla, desquamated skin, target lesions.  Neurological:     General: No focal deficit present.     Mental Status: She is alert and oriented to person, place, and time.  Psychiatric:        Mood and Affect: Mood and affect normal.     ED Results / Procedures / Treatments   Labs (all labs ordered are listed, but only abnormal results are displayed) Labs Reviewed - No data to display  EKG None  Radiology US Venous Img Lower Unilateral Left (DVT)  Result Date: 03/31/2020 CLINICAL DATA:  Acute left lower extremity swelling. EXAM: Left LOWER EXTREMITY VENOUS DOPPLER ULTRASOUND TECHNIQUE: Gray-scale sonography with compression, as well as color and duplex ultrasound, were performed to evaluate the deep venous system(s) from the level of the common femoral vein through the popliteal and proximal calf veins. COMPARISON:  None. FINDINGS: VENOUS Normal compressibility of the common femoral, superficial femoral, and popliteal veins, as well as the visualized calf veins. Visualized portions of profunda femoral vein and great saphenous vein unremarkable. No filling defects to suggest DVT on grayscale or color Doppler imaging. Doppler waveforms show normal direction of venous flow, normal respiratory plasticity and response to augmentation. Limited views of the contralateral common femoral vein are unremarkable. OTHER  None. Limitations: none IMPRESSION: Negative. Electronically Signed   By: Lupita Raider M.D.   On: 03/31/2020 13:47    Procedures Procedures (including critical care time)  Medications Ordered in ED Medications  cefTRIAXone (ROCEPHIN) injection 1 g (1 g Intramuscular Given 03/31/20 1430)  lidocaine (PF) (XYLOCAINE) 1 % injection (1.2 mLs  Given 03/31/20 1430)    ED Course  I have reviewed the triage vital signs and the nursing notes.  Pertinent labs & imaging results that were available during my care of the patient were reviewed by me and considered in my medical decision making (see chart for details).  Patient sent over from urgent care for DVT rule out.  Ultrasound here does not show any evidence of DVT.  She has no chest pain, shortness of breath.  She has a normal musculoskeletal exam.  She is neurovascularly intact.  Clinically patient's leg resembles cellulitis.  As such was started on antibiotics here in ED.  Area was marked.  Discussed follow-up with PCP and strict return precautions.  She will return for any worsening symptoms.  The patient has been appropriately medically screened and/or stabilized in the ED. I have low suspicion for any other emergent medical condition which would require further screening, evaluation or treatment in the ED or require inpatient management.  Patient is hemodynamically stable and in no acute distress.  Patient able to ambulate in department prior to ED.  Evaluation does not show acute pathology that would require ongoing or additional emergent interventions while in the emergency department or further inpatient treatment.  I have discussed the diagnosis with the patient and answered all questions.  Pain is been managed while in the emergency department and patient has no further complaints prior to discharge.  Patient is comfortable with plan discussed in room and is stable for discharge at this time.  I have discussed strict return precautions for  returning to the emergency department.  Patient was encouraged to follow-up with PCP/specialist refer to at discharge.    MDM Rules/Calculators/A&P                          Final Clinical Impression(s) / ED Diagnoses Final diagnoses:  Cellulitis of left lower extremity    Rx / DC Orders ED Discharge Orders         Ordered    sulfamethoxazole-trimethoprim (BACTRIM DS) 800-160 MG tablet  2 times daily        03/31/20 1436           Haeley Fordham A, PA-C 03/31/20 1439    Derwood Kaplan, MD 04/01/20 1753

## 2020-03-31 NOTE — ED Triage Notes (Addendum)
Monday woke up with a bruise on her left front shin and is swollen all the way down to her feet. Pt said painful, warm to the touch

## 2020-04-03 LAB — NOVEL CORONAVIRUS, NAA: SARS-CoV-2, NAA: DETECTED — AB

## 2020-04-11 ENCOUNTER — Ambulatory Visit
Admission: EM | Admit: 2020-04-11 | Discharge: 2020-04-11 | Disposition: A | Discharge status: Home / Self Care | Payer: Medicaid Other | Attending: Urgent Care | Admitting: Urgent Care

## 2020-04-11 ENCOUNTER — Other Ambulatory Visit: Payer: Self-pay

## 2020-04-11 DIAGNOSIS — M79672 Pain in left foot: Secondary | ICD-10-CM | POA: Diagnosis not present

## 2020-04-11 DIAGNOSIS — M7989 Other specified soft tissue disorders: Secondary | ICD-10-CM

## 2020-04-11 MED ORDER — PREDNISONE 20 MG PO TABS: ORAL_TABLET | ORAL | 0 refills | Status: DC

## 2020-04-11 NOTE — ED Triage Notes (Signed)
Pt was here 2 weeks ago with warm and swollen calf, was sent for DVT study and was negative. Was given antibiotic for cellulitis.Pt said now it is in her foot. painfull and swollen and red. Pt having hard top walk

## 2020-04-11 NOTE — ED Provider Notes (Signed)
[facility]   MRN: [id number] DOB: February 07, 1986  Subjective:   Audrey Armstrong is a 35 y.o. female presenting for 2 week history of persistent left foot pain. She initially also had pain of her anterior calf. Was seen last week, had negative DVT study. She was started on Bactrim, had some improvement until she finished the antibiotic 2 days ago. Denies fever, n/v, wound, history of clotting disorder. Has 2 jobs, works walking extensive hours. Denies history of gout. Wears compression stockings/socks.   No current facility-administered medications for this encounter.  Current Outpatient Medications:  .  acetaminophen (TYLENOL) 500 MG tablet, Take 1 tablet (500 mg total) by mouth every 6 (six) hours as needed for fever (chills, body aches)., Disp: 30 tablet, Rfl: 0 .  ibuprofen (ADVIL,MOTRIN) 400 MG tablet, Take 1 tablet (400 mg total) by mouth every 8 (eight) hours as needed., Disp: 15 tablet, Rfl: 0 .  naproxen (NAPROSYN) 500 MG tablet, Take 1 tablet (500 mg total) by mouth 2 (two) times daily between meals as needed for moderate pain (fever, chills)., Disp: 30 tablet, Rfl: 0 .  ondansetron (ZOFRAN ODT) 4 MG disintegrating tablet, Take 1 tablet (4 mg total) by mouth every 8 (eight) hours as needed for nausea or vomiting., Disp: 12 tablet, Rfl: 0   No Known Allergies  No past medical history on file.   Past Surgical History:  Procedure Laterality Date  . TUBAL LIGATION      No family history on file.  Social History   Tobacco Use  . Smoking status: Never Smoker  . Smokeless tobacco: Never Used  Vaping Use  . Vaping Use: Never used  Substance Use Topics  . Alcohol use: No  . Drug use: No    ROS   Objective:   Vitals: BP 124/78 (BP Location: Right Arm)   Pulse 84   Temp 97.8 F (36.6 C) (Oral)   Resp 16   LMP 03/26/2020   SpO2 95%   Physical Exam Constitutional:      General: She is not in acute distress.    Appearance: Normal appearance. She is  well-developed. She is not ill-appearing, toxic-appearing or diaphoretic.  HENT:     Head: Normocephalic and atraumatic.     Nose: Nose normal.     Mouth/Throat:     Mouth: Mucous membranes are moist.     Pharynx: Oropharynx is clear.  Eyes:     General: No scleral icterus.       Right eye: No discharge.        Left eye: No discharge.     Extraocular Movements: Extraocular movements intact.     Conjunctiva/sclera: Conjunctivae normal.     Pupils: Pupils are equal, round, and reactive to light.  Cardiovascular:     Rate and Rhythm: Normal rate.  Pulmonary:     Effort: Pulmonary effort is normal.  Musculoskeletal:     Left lower leg: No swelling, deformity, lacerations, tenderness or bony tenderness. No edema.     Left ankle: No swelling, deformity, ecchymosis or lacerations. No tenderness. Normal range of motion.     Left foot: Decreased range of motion. Tenderness (over area outlined with slight warmth, erythema) present. No swelling, deformity or laceration.       Feet:  Skin:    General: Skin is warm and dry.  Neurological:     General: No focal deficit present.     Mental Status: She is alert and oriented to person, place, and time.  Psychiatric:        Mood and Affect: Mood normal.        Behavior: Behavior normal.        Thought Content: Thought content normal.        Judgment: Judgment normal.     Assessment and Plan :   PDMP not reviewed this encounter.  1. Left foot pain   2. Foot swelling     Patient actually eats a lot of fish and other foods high in purines. Counseled on low purine diet, cutting back her work hours. Recommended prednisone for possible gout attack. Follow up with PCP. Counseled patient on potential for adverse effects with medications prescribed/recommended today, ER and return-to-clinic precautions discussed, patient verbalized understanding.    Wallis Bamberg, [place] 04/12/20 [phone]

## 2020-04-28 ENCOUNTER — Emergency Department (HOSPITAL_COMMUNITY): Payer: Medicaid Other

## 2020-04-28 ENCOUNTER — Other Ambulatory Visit: Payer: Self-pay

## 2020-04-28 ENCOUNTER — Inpatient Hospital Stay (HOSPITAL_COMMUNITY)
Admission: EM | Admit: 2020-04-28 | Discharge: 2020-05-02 | DRG: 872 | Disposition: A | Discharge status: Home / Self Care | Payer: Medicaid Other | Attending: Student | Admitting: Student

## 2020-04-28 ENCOUNTER — Encounter (HOSPITAL_COMMUNITY): Payer: Self-pay | Admitting: Emergency Medicine

## 2020-04-28 DIAGNOSIS — Z20822 Contact with and (suspected) exposure to covid-19: Secondary | ICD-10-CM | POA: Diagnosis present

## 2020-04-28 DIAGNOSIS — G43909 Migraine, unspecified, not intractable, without status migrainosus: Secondary | ICD-10-CM | POA: Diagnosis present

## 2020-04-28 DIAGNOSIS — M79605 Pain in left leg: Secondary | ICD-10-CM

## 2020-04-28 DIAGNOSIS — L0390 Cellulitis, unspecified: Secondary | ICD-10-CM

## 2020-04-28 DIAGNOSIS — M7989 Other specified soft tissue disorders: Secondary | ICD-10-CM | POA: Diagnosis present

## 2020-04-28 DIAGNOSIS — Z6841 Body Mass Index (BMI) 40.0 and over, adult: Secondary | ICD-10-CM | POA: Diagnosis not present

## 2020-04-28 DIAGNOSIS — A4189 Other specified sepsis: Secondary | ICD-10-CM | POA: Diagnosis not present

## 2020-04-28 DIAGNOSIS — M79641 Pain in right hand: Secondary | ICD-10-CM | POA: Diagnosis present

## 2020-04-28 DIAGNOSIS — L03116 Cellulitis of left lower limb: Secondary | ICD-10-CM | POA: Diagnosis present

## 2020-04-28 DIAGNOSIS — Z8616 Personal history of COVID-19: Secondary | ICD-10-CM

## 2020-04-28 DIAGNOSIS — E66813 Obesity, class 3: Secondary | ICD-10-CM

## 2020-04-28 DIAGNOSIS — A419 Sepsis, unspecified organism: Secondary | ICD-10-CM | POA: Diagnosis not present

## 2020-04-28 DIAGNOSIS — R2242 Localized swelling, mass and lump, left lower limb: Secondary | ICD-10-CM

## 2020-04-28 DIAGNOSIS — M79673 Pain in unspecified foot: Secondary | ICD-10-CM | POA: Diagnosis present

## 2020-04-28 LAB — CBC WITH DIFFERENTIAL/PLATELET
Abs Immature Granulocytes: 0.18 10*3/uL — ABNORMAL HIGH (ref 0.00–0.07)
Basophils Absolute: 0 10*3/uL (ref 0.0–0.1)
Basophils Relative: 0 %
Eosinophils Absolute: 0 10*3/uL (ref 0.0–0.5)
Eosinophils Relative: 0 %
HCT: 43.8 % (ref 36.0–46.0)
Hemoglobin: 14 g/dL (ref 12.0–15.0)
Immature Granulocytes: 1 %
Lymphocytes Relative: 6 %
Lymphs Abs: 1.2 10*3/uL (ref 0.7–4.0)
MCH: 27.5 pg (ref 26.0–34.0)
MCHC: 32 g/dL (ref 30.0–36.0)
MCV: 85.9 fL (ref 80.0–100.0)
Monocytes Absolute: 1.2 10*3/uL — ABNORMAL HIGH (ref 0.1–1.0)
Monocytes Relative: 6 %
Neutro Abs: 17 10*3/uL — ABNORMAL HIGH (ref 1.7–7.7)
Neutrophils Relative %: 87 %
Platelets: 176 10*3/uL (ref 150–400)
RBC: 5.1 MIL/uL (ref 3.87–5.11)
RDW: 14.5 % (ref 11.5–15.5)
WBC: 19.6 10*3/uL — ABNORMAL HIGH (ref 4.0–10.5)
nRBC: 0 % (ref 0.0–0.2)

## 2020-04-28 LAB — URINALYSIS, ROUTINE W REFLEX MICROSCOPIC
Bacteria, UA: NONE SEEN
Bilirubin Urine: NEGATIVE
Glucose, UA: NEGATIVE mg/dL
Hgb urine dipstick: NEGATIVE
Ketones, ur: 5 mg/dL — AB
Nitrite: NEGATIVE
Protein, ur: NEGATIVE mg/dL
Specific Gravity, Urine: 1.017 (ref 1.005–1.030)
pH: 9 — ABNORMAL HIGH (ref 5.0–8.0)

## 2020-04-28 LAB — COMPREHENSIVE METABOLIC PANEL
ALT: 24 U/L (ref 0–44)
AST: 20 U/L (ref 15–41)
Albumin: 3.8 g/dL (ref 3.5–5.0)
Alkaline Phosphatase: 53 U/L (ref 38–126)
Anion gap: 10 (ref 5–15)
BUN: 8 mg/dL (ref 6–20)
CO2: 23 mmol/L (ref 22–32)
Calcium: 8.7 mg/dL — ABNORMAL LOW (ref 8.9–10.3)
Chloride: 103 mmol/L (ref 98–111)
Creatinine, Ser: 0.9 mg/dL (ref 0.44–1.00)
GFR, Estimated: >60 mL/min (ref >60)
Glucose, Bld: 113 mg/dL — ABNORMAL HIGH (ref 70–99)
Potassium: 3.7 mmol/L (ref 3.5–5.1)
Sodium: 136 mmol/L (ref 135–145)
Total Bilirubin: 0.6 mg/dL (ref 0.3–1.2)
Total Protein: 7.6 g/dL (ref 6.5–8.1)

## 2020-04-28 LAB — I-STAT BETA HCG BLOOD, ED (MC, WL, AP ONLY): I-stat hCG, quantitative: <5 m[IU]/mL (ref <5)

## 2020-04-28 LAB — RESP PANEL BY RT-PCR (FLU A&B, COVID) ARPGX2
Influenza A by PCR: NEGATIVE
Influenza B by PCR: NEGATIVE
SARS Coronavirus 2 by RT PCR: NEGATIVE

## 2020-04-28 LAB — LACTIC ACID, PLASMA
Lactic Acid, Venous: 1 mmol/L (ref 0.5–1.9)
Lactic Acid, Venous: 0.8 mmol/L (ref 0.5–1.9)

## 2020-04-28 LAB — PROTIME-INR
INR: 1.1 (ref 0.8–1.2)
Prothrombin Time: 13.3 seconds (ref 11.4–15.2)

## 2020-04-28 LAB — HIV ANTIBODY (ROUTINE TESTING W REFLEX): HIV Screen 4th Generation wRfx: NONREACTIVE

## 2020-04-28 LAB — APTT: aPTT: 29 seconds (ref 24–36)

## 2020-04-28 MED ORDER — LACTATED RINGERS IV SOLN: INTRAVENOUS | Status: DC

## 2020-04-28 MED ORDER — VANCOMYCIN HCL IN DEXTROSE 1-5 GM/200ML-% IV SOLN: 1000.0000 mg | Freq: Once | INTRAVENOUS | Status: DC

## 2020-04-28 MED ORDER — VANCOMYCIN HCL 1250 MG/250ML IV SOLN
1250.0000 mg | Freq: Two times a day (BID) | INTRAVENOUS | Status: DC
  Administered 2020-04-28 – 2020-04-29 (×2): 1250 mg via INTRAVENOUS
  Filled 2020-04-28 (×2): qty 250

## 2020-04-28 MED ORDER — SODIUM CHLORIDE 0.9 % IV SOLN: 2.0000 g | Freq: Once | INTRAVENOUS | Status: DC

## 2020-04-28 MED ORDER — LACTATED RINGERS IV BOLUS (SEPSIS)
1000.0000 mL | Freq: Once | INTRAVENOUS | Status: AC
  Administered 2020-04-28: 1000 mL via INTRAVENOUS

## 2020-04-28 MED ORDER — ENOXAPARIN SODIUM 40 MG/0.4ML ~~LOC~~ SOLN
40.0000 mg | SUBCUTANEOUS | Status: DC
  Administered 2020-04-28 – 2020-05-01 (×4): 40 mg via SUBCUTANEOUS
  Filled 2020-04-28 (×4): qty 0.4

## 2020-04-28 MED ORDER — ACETAMINOPHEN 650 MG RE SUPP: 650.0000 mg | Freq: Four times a day (QID) | RECTAL | Status: DC | PRN

## 2020-04-28 MED ORDER — MORPHINE SULFATE (PF) 4 MG/ML IV SOLN
4.0000 mg | Freq: Once | INTRAVENOUS | Status: AC
  Administered 2020-04-28: 4 mg via INTRAVENOUS
  Filled 2020-04-28: qty 1

## 2020-04-28 MED ORDER — HYDROCODONE-ACETAMINOPHEN 5-325 MG PO TABS
1.0000 | ORAL_TABLET | ORAL | Status: DC | PRN
  Administered 2020-04-29 (×2): 1 via ORAL
  Administered 2020-04-29 – 2020-04-30 (×2): 2 via ORAL
  Filled 2020-04-28: qty 1
  Filled 2020-04-28 (×2): qty 2
  Filled 2020-04-28: qty 1
  Filled 2020-04-28: qty 2

## 2020-04-28 MED ORDER — ACETAMINOPHEN 325 MG PO TABS
650.0000 mg | ORAL_TABLET | Freq: Once | ORAL | Status: AC
  Administered 2020-04-28: 650 mg via ORAL
  Filled 2020-04-28: qty 2

## 2020-04-28 MED ORDER — ONDANSETRON HCL 4 MG PO TABS: 4.0000 mg | ORAL_TABLET | Freq: Four times a day (QID) | ORAL | Status: DC | PRN

## 2020-04-28 MED ORDER — ONDANSETRON HCL 4 MG/2ML IJ SOLN: 4.0000 mg | Freq: Four times a day (QID) | INTRAMUSCULAR | Status: DC | PRN

## 2020-04-28 MED ORDER — SODIUM CHLORIDE 0.9 % IV SOLN
2.0000 g | Freq: Three times a day (TID) | INTRAVENOUS | Status: DC
  Administered 2020-04-28 – 2020-05-01 (×8): 2 g via INTRAVENOUS
  Filled 2020-04-28 (×9): qty 2

## 2020-04-28 MED ORDER — LACTATED RINGERS IV BOLUS (SEPSIS)
800.0000 mL | Freq: Once | INTRAVENOUS | Status: AC
  Administered 2020-04-28: 800 mL via INTRAVENOUS

## 2020-04-28 MED ORDER — ACETAMINOPHEN 325 MG PO TABS
650.0000 mg | ORAL_TABLET | Freq: Four times a day (QID) | ORAL | Status: DC | PRN
  Administered 2020-04-30 – 2020-05-01 (×2): 650 mg via ORAL
  Filled 2020-04-28 (×2): qty 2

## 2020-04-28 MED ORDER — VANCOMYCIN HCL 2000 MG/400ML IV SOLN
2000.0000 mg | Freq: Once | INTRAVENOUS | Status: AC
  Administered 2020-04-28: 2000 mg via INTRAVENOUS
  Filled 2020-04-28: qty 400

## 2020-04-28 MED ORDER — POLYETHYLENE GLYCOL 3350 17 G PO PACK: 17.0000 g | PACK | Freq: Every day | ORAL | Status: DC | PRN

## 2020-04-28 MED ORDER — ONDANSETRON HCL 4 MG/2ML IJ SOLN
4.0000 mg | Freq: Once | INTRAMUSCULAR | Status: AC
Start: 2020-04-28 — End: 2020-04-28
  Administered 2020-04-28: 4 mg via INTRAVENOUS
  Filled 2020-04-28: qty 2

## 2020-04-28 MED ORDER — SODIUM CHLORIDE 0.9% FLUSH
3.0000 mL | Freq: Two times a day (BID) | INTRAVENOUS | Status: DC
  Administered 2020-04-28 – 2020-05-01 (×5): 3 mL via INTRAVENOUS

## 2020-04-28 NOTE — Progress Notes (Signed)
Pharmacy Antibiotic Note  Audrey Armstrong is a 35 y.o. female admitted on 04/28/2020 with sepsis, nonpurulent  cellulitis.  Pharmacy has been consulted for vancomycin and cefepime dosing.  Plan: Cefepime 2g IV q8h  Vancomycin 2g IV x1 then 1250 mg IV Q12h (SCr 0.9, est AUC 431) Measure Vanc peak and trough at steady state if needed Follow up renal function, culture results, and clinical course. Follow up de-escalation when improving.   Height: 5\' 5"  (165.1 cm) Weight: 115.7 kg (255 lb) IBW/kg (Calculated) : 57  Temp (24hrs), Avg:100.3 F (37.9 C), Min:98.6 F (37 C), Max:103.1 F (39.5 C)  Recent Labs  Lab 04/28/20 0726  WBC 19.6*  CREATININE 0.90  LATICACIDVEN 1.0    Estimated Creatinine Clearance: 110.9 mL/min (by C-G formula based on SCr of 0.9 mg/dL).    No Known Allergies  Antimicrobials this admission: 2/12 Vancomycin >> 2/12 Cefepime >>   Dose adjustments this admission:   Microbiology results: 2/12 UCx : 2/12 BCx:    Thank you for allowing pharmacy to be a part of this patient's care.  4/12 PharmD, BCPS Clinical Pharmacist [facility] main pharmacy [phone] 04/28/2020 10:55 AM

## 2020-04-28 NOTE — H&P (Addendum)
History and Physical        Hospital Admission Note Date: 04/28/2020  Patient name: Audrey Armstrong Medical record number: [id number] Date of birth: 27-Jan-1986 Age: 35 y.o. Gender: female  PCP: Patient, No Pcp Per  Chief Complaint    Chief Complaint  Patient presents with  . Foot Pain  . Hand Pain      HPI:   This is a 35 year old female with no significant past medical history who presented to the ED with left foot pain for several weeks and an episode of right hand pain/swelling yesterday which has since resolved.  Patient was initially seen in the ED on 03/31/2020 for left anterior leg pain and swelling that started as a bruise and had worsening pain and swelling since.  She also had malaise and migraines with emesis at the time and tested positive for COVID-19, she had a negative [place] for DVT.  She was given Rocephin and discharged with Bactrim for cellulitis.  However, she continued to have persistent left foot pain and was seen in an urgent care on 1/26 and was prescribed prednisone for suspected gout flare and completed prednisone 10 days ago.  She states that her symptoms resolved completely after the prednisone however yesterday her symptoms of left lower extremity pain, erythema and warmth and pain on ambulation suddenly returned.  Additionally, she had sudden new onset right hand swelling and pain without erythema which lasted the entire day but has since resolved.  Patient denies any trauma, animal scratch or bite, and has not been working in the garden.  States she has a remote history of chlamydial infection in 2005 which was treated and has no new sexual partners or symptoms of STDs.  Denies any other joint pain.  First noticed that she was febrile in the ED today.  ED Course: Febrile, tachycardic, tachypneic,  hypertensive, on room air.  Rigors noted on ED provider exam  notable Labs: Sodium 136, K3.7, BUN 8, creatinine 0.9, lactic acid 1.0, WBC 19.6, Hb 14.0, COVID-19 and flu negative, UA unremarkable.. Notable Imaging: CXR and left tib/fib XR unremarkable.  Left foot XR-soft tissue swelling without soft tissue air or radiopaque foreign body, no erosive change or bony destruction, joint spaces normal, no fracture, inferior calcaneal spur noted.  Sepsis protocol was started in the ED.  Patient received Tylenol, morphine, Zofran, 2 L LR bolus with LR infusion and vancomycin.    Vitals:   04/28/20 0830 04/28/20 1030  BP: 125/70 (!) 108/56  Pulse: 97 93  Resp: 18   Temp:    SpO2: 98% 94%     Review of Systems:  Review of Systems  All other systems reviewed and are negative.   Medical/Social/Family History   Past Medical History: History reviewed. No pertinent past medical history.  Past Surgical History:  Procedure Laterality Date  . TUBAL LIGATION      Medications: Prior to Admission medications   Medication Sig Start Date End Date Taking? Authorizing Provider  acetaminophen (TYLENOL) 500 MG tablet Take 1 tablet (500 mg total) by mouth every 6 (six) hours as needed for fever (chills, body aches). 03/02/19  Yes Lorelee New, PA-C  ibuprofen (ADVIL) 200 MG tablet Take 800 mg  by mouth every 6 (six) hours as needed for mild pain.   Yes [provider]  ibuprofen (ADVIL,MOTRIN) 400 MG tablet Take 1 tablet (400 mg total) by mouth every 8 (eight) hours as needed. Patient not taking: Reported on 04/28/2020 02/12/16   Azalia Bilis, MD  naproxen (NAPROSYN) 500 MG tablet Take 1 tablet (500 mg total) by mouth 2 (two) times daily between meals as needed for moderate pain (fever, chills). Patient not taking: Reported on 04/28/2020 03/02/19   Evelena Leyden L, PA-C  ondansetron (ZOFRAN ODT) 4 MG disintegrating tablet Take 1 tablet (4 mg total) by mouth every 8 (eight) hours as needed for nausea or vomiting. Patient not taking: Reported on 04/28/2020  10/03/16   Phillis Haggis, MD  predniSONE (DELTASONE) 20 MG tablet Take 2 tablets daily with breakfast. Patient not taking: Reported on 04/28/2020 04/11/20   Wallis Bamberg, PA-C    Allergies:  No Known Allergies  Social History:  reports that she has never smoked. She has never used smokeless tobacco. She reports that she does not drink alcohol and does not use drugs.  Family History: No family history on file.   Objective   Physical Exam: Blood pressure (!) 108/56, pulse 93, temperature 98.9 F (37.2 C), temperature source Oral, resp. rate 18, height 5\' 5"  (1.651 m), weight 115.7 kg, last menstrual period 03/31/2020, SpO2 94 %.  Physical Exam Vitals and nursing note reviewed. Exam conducted with a chaperone present.  Constitutional:      General: She is not in acute distress.    Appearance: She is obese.  HENT:     Head: Normocephalic.  Eyes:     Conjunctiva/sclera: Conjunctivae normal.  Cardiovascular:     Rate and Rhythm: Normal rate and regular rhythm.     Heart sounds: No murmur heard.   Pulmonary:     Effort: Pulmonary effort is normal. No respiratory distress.     Breath sounds: Normal breath sounds.  Abdominal:     General: Abdomen is flat. There is no distension.  Musculoskeletal:     Right ankle: Normal.     Left ankle: Normal. Normal range of motion.     Left foot: No deformity, laceration or bony tenderness. Drop-foot:       Comments: Right hand without swelling, pain and with intact grip strength  Left lower extremity with nonpurulent tender, erythematous and warm rash.  No obvious open skin wound on left lower extremity  Left foot able to move toes but with some pain on active ROM. No pain on Passive ROM or tenderness to palpation of foot joints  Skin:    Findings: Rash present.  Neurological:     Mental Status: She is alert. Mental status is at baseline.  Psychiatric:        Mood and Affect: Mood normal.        Behavior: Behavior normal.        LABS on Admission: I have personally reviewed all the labs and imaging below    Basic Metabolic Panel: Recent Labs  Lab 04/28/20 0726  NA 136  K 3.7  CL 103  CO2 23  GLUCOSE 113*  BUN 8  CREATININE 0.90  CALCIUM 8.7*   Liver Function Tests: Recent Labs  Lab 04/28/20 0726  AST 20  ALT 24  ALKPHOS 53  BILITOT 0.6  PROT 7.6  ALBUMIN 3.8   No results for input(s): LIPASE, AMYLASE in the last 168 hours. No results for input(s): AMMONIA in the  last 168 hours. CBC: Recent Labs  Lab 04/28/20 0726  WBC 19.6*  NEUTROABS 17.0*  HGB 14.0  HCT 43.8  MCV 85.9  PLT 176   Cardiac Enzymes: No results for input(s): CKTOTAL, CKMB, CKMBINDEX, TROPONINI in the last 168 hours. BNP: Invalid input(s): POCBNP CBG: No results for input(s): GLUCAP in the last 168 hours.  Radiological Exams on Admission:  DG Tibia/Fibula Left  Result Date: 04/28/2020 CLINICAL DATA:  Pain and redness EXAM: LEFT TIBIA AND FIBULA - 2 VIEW COMPARISON:  None. FINDINGS: Frontal and lateral views were obtained. No fracture or dislocation. No abnormal periosteal reaction. No soft tissue air or radiopaque foreign body. Joint spaces appear unremarkable. IMPRESSION: No fracture or dislocation. No bony destruction. No soft tissue air. Electronically Signed   By: Bretta Bang III M.D.   On: 04/28/2020 09:13   DG Chest Port 1 View  Result Date: 04/28/2020 CLINICAL DATA:  Cellulitis EXAM: PORTABLE CHEST 1 VIEW COMPARISON:  None. FINDINGS: Lungs are clear. Heart size and pulmonary vascularity are normal. No adenopathy. No bone lesions. IMPRESSION: Lungs clear.  Cardiac silhouette normal. Electronically Signed   By: Bretta Bang III M.D.   On: 04/28/2020 09:11   DG Foot Complete Left  Result Date: 04/28/2020 CLINICAL DATA:  Pain and redness EXAM: LEFT FOOT - COMPLETE 3+ VIEW COMPARISON:  None. FINDINGS: Frontal, oblique, and lateral views were obtained. There is soft tissue swelling dorsally. No  radiopaque foreign body or soft tissue air. There is no fracture or dislocation. No appreciable joint space narrowing or erosion. No bony destruction. There is an inferior calcaneal spur. IMPRESSION: Soft tissue swelling without soft tissue air or radiopaque foreign body. No erosive change or bony destruction. Joint spaces appear normal. No fracture or dislocation. There is an inferior calcaneal spur. Electronically Signed   By: Bretta Bang III M.D.   On: 04/28/2020 09:12   VAS [place] LOWER EXTREMITY VENOUS (DVT) (ONLY [facility] & [facility])  Result Date: 04/28/2020  Lower Venous DVT Study Indications: Edema, and Pain.  Limitations: Patient pain tolerance, unable to tolerate compressions. Comparison Study: no prior Performing Technologist: Blanch Media RVS  Examination Guidelines: A complete evaluation includes B-mode imaging, spectral Doppler, color Doppler, and power Doppler as needed of all accessible portions of each vessel. Bilateral testing is considered an integral part of a complete examination. Limited examinations for reoccurring indications may be performed as noted. The reflux portion of the exam is performed with the patient in reverse Trendelenburg.  +-----+---------------+---------+-----------+----------+--------------+ RIGHTCompressibilityPhasicitySpontaneityPropertiesThrombus Aging +-----+---------------+---------+-----------+----------+--------------+ CFV                 Yes      Yes                                 +-----+---------------+---------+-----------+----------+--------------+   +---------+---------------+---------+-----------+----------+-------------------+ LEFT     CompressibilityPhasicitySpontaneityPropertiesThrombus Aging      +---------+---------------+---------+-----------+----------+-------------------+ CFV      Full           Yes      Yes                                      +---------+---------------+---------+-----------+----------+-------------------+ SFJ       Full                                                             +---------+---------------+---------+-----------+----------+-------------------+  FV Prox                 Yes      Yes                                      +---------+---------------+---------+-----------+----------+-------------------+ FV Mid   Full                                                             +---------+---------------+---------+-----------+----------+-------------------+ FV DistalFull                                                             +---------+---------------+---------+-----------+----------+-------------------+ PFV                     Yes      Yes                                      +---------+---------------+---------+-----------+----------+-------------------+ POP      Full           Yes      Yes                                      +---------+---------------+---------+-----------+----------+-------------------+ PTV      Full                                                             +---------+---------------+---------+-----------+----------+-------------------+ PERO                                                  Not well visualized +---------+---------------+---------+-----------+----------+-------------------+     Summary: RIGHT: - No evidence of common femoral vein obstruction.  LEFT: - There is no evidence of deep vein thrombosis in the lower extremity. However, portions of this examination were limited- see technologist comments above.  - No cystic structure found in the popliteal fossa.  *See table(s) above for measurements and observations.    Preliminary       EKG: sinus tachycardia   A & P   Principal Problem:   Sepsis (HCC) Active Problems:   Cellulitis   Obesity, Class III, BMI 40-49.9 (morbid obesity) (HCC)   1. Sepsis without septic shock secondary to nonpurulent left lower extremity cellulitis a. Sepsis criteria: Fever,  tachycardia, tachypnea, leukocytosis b. Unlikely septic joint or gout c. Continue IV fluids d. Follow-up blood cultures e. Vancomycin and cefepime per cellulitis order set  2. Right hand pain/swelling of unclear etiology, resolved a. Continue to monitor for recurrence  b. If recurrence could consider atypical infectious work-up  3. Morbid obesity a. Body mass index is 42.43 kg/m.  b. Recommend lifestyle modification prior to discharge    DVT prophylaxis: Lovenox   Code Status: Full Code  Diet: Heart healthy Family Communication: Admission, patients condition and plan of care including tests being ordered have been discussed with the patient who indicates understanding and agrees with the plan and Code Status. Patient's husband was updated  Disposition Plan: The appropriate patient status for this patient is INPATIENT. Inpatient status is judged to be reasonable and necessary in order to provide the required intensity of service to ensure the patient's safety. The patient's presenting symptoms, physical exam findings, and initial radiographic and laboratory data in the context of their chronic comorbidities is felt to place them at high risk for further clinical deterioration. Furthermore, it is not anticipated that the patient will be medically stable for discharge from the hospital within 2 midnights of admission. The following factors support the patient status of inpatient.   " The patient's presenting symptoms include left lower extremity pain, rash, right hand pain. " The worrisome physical exam findings include left lower extremity pain, swelling, rash. " The initial radiographic and laboratory data are worrisome because of leukocytosis. " The chronic co-morbidities include morbid obesity.   * I certify that at the point of admission it is my clinical judgment that the patient will require inpatient hospital care spanning beyond 2 midnights from the point of admission due to high  intensity of service, high risk for further deterioration and high frequency of surveillance required.*   Status is: Inpatient  Remains inpatient appropriate because:IV treatments appropriate due to intensity of illness or inability to take PO and Inpatient level of care appropriate due to severity of illness   Dispo: The patient is from: Home              Anticipated d/c is to: Home              Anticipated d/c date is: 3 days              Patient currently is not medically stable to d/c.   Difficult to place patient No      Consultants  . None  Procedures  . None  Time Spent on Admission: 65 minutes    Jae DireJared E Torell Minder, DO [facility]  04/28/2020, 10:55 AM

## 2020-04-28 NOTE — ED Provider Notes (Signed)
[facility]-EMERGENCY DEPT Provider Note   CSN: [id number] Arrival date & time: 04/28/20  0456    History Chief Complaint  Patient presents with  . Foot Pain  . Hand Pain    Audrey Armstrong is Armstrong 35 y.o. female whom I am familiar with as I saw her approximately 1 month ago.  At that time and cellulitis to dorsum of left foot.  Was given Rocephin and prescribed Bactrim.  Also had Armstrong DVT study at that time which was negative.  Patient states significant improvement in cellulitis with Bactrim however had recurrence approximately 2 weeks ago.  She was seen by urgent care at that time who prescribed her prednisone. Did not get Abx. Patient states worsening erythema since she started on Prednisone, states UC told her it was likely gout. No longer taking the Prednisone. Erythema now extending up leg. Area warm, tender to palpation. Pain increasing to distal femur. No back pain, IVDU. Mild anterior swelling. She denies nausea, vomiting, bony tenderness, paresthesias, weakness.  Chills at home.Denies additional aggravating or alleviating factors.  Has not been on steroids for greater than 10 days.  History obtained from patient, family in room and past medical records.  No interpreter used.  HPI     History reviewed. No pertinent past medical history.  Patient Active Problem List   Diagnosis Date Noted  . Sepsis (HCC) 04/28/2020    Past Surgical History:  Procedure Laterality Date  . TUBAL LIGATION       OB History   No obstetric history on file.     No family history on file.  Social History   Tobacco Use  . Smoking status: Never Smoker  . Smokeless tobacco: Never Used  Vaping Use  . Vaping Use: Never used  Substance Use Topics  . Alcohol use: No  . Drug use: No    Home Medications Prior to Admission medications   Medication Sig Start Date End Date Taking? Authorizing Provider  acetaminophen (TYLENOL) 500 MG tablet Take 1 tablet (500 mg total) by  mouth every 6 (six) hours as needed for fever (chills, body aches). 03/02/19  Yes Lorelee New, PA-C  ibuprofen (ADVIL) 200 MG tablet Take 800 mg by mouth every 6 (six) hours as needed for mild pain.   Yes [provider]  ibuprofen (ADVIL,MOTRIN) 400 MG tablet Take 1 tablet (400 mg total) by mouth every 8 (eight) hours as needed. Patient not taking: Reported on 04/28/2020 02/12/16   Azalia Bilis, MD  naproxen (NAPROSYN) 500 MG tablet Take 1 tablet (500 mg total) by mouth 2 (two) times daily between meals as needed for moderate pain (fever, chills). Patient not taking: Reported on 04/28/2020 03/02/19   Evelena Leyden L, PA-C  ondansetron (ZOFRAN ODT) 4 MG disintegrating tablet Take 1 tablet (4 mg total) by mouth every 8 (eight) hours as needed for nausea or vomiting. Patient not taking: Reported on 04/28/2020 10/03/16   Phillis Haggis, MD  predniSONE (DELTASONE) 20 MG tablet Take 2 tablets daily with breakfast. Patient not taking: Reported on 04/28/2020 04/11/20   Wallis Bamberg, PA-C    Allergies    Patient has no known allergies.  Review of Systems   Review of Systems  Constitutional: Negative.   HENT: Negative.   Respiratory: Negative.   Cardiovascular: Negative.  Negative for chest pain.  Gastrointestinal: Negative.   Genitourinary: Negative.   Musculoskeletal: Negative.   Skin: Positive for rash.  Neurological: Negative.   All other systems reviewed  and are negative.   Physical Exam Updated Vital Signs BP 125/70   Pulse 97   Temp 98.9 F (37.2 C) (Oral)   Resp 18   Ht 5\' 5"  (1.651 m)   Wt 115.7 kg   LMP 03/31/2020   SpO2 98%   BMI 42.43 kg/m   Physical Exam Vitals and nursing note reviewed.  Constitutional:      General: She is not in acute distress.    Appearance: She is well-developed and well-nourished. She is ill-appearing. She is not toxic-appearing or diaphoretic.     Comments: Rigors on exam  HENT:     Head: Normocephalic and atraumatic.     Nose:  Nose normal.     Mouth/Throat:     Mouth: Mucous membranes are moist.  Eyes:     Pupils: Pupils are equal, round, and reactive to light.  Cardiovascular:     Rate and Rhythm: Tachycardia present.     Pulses: Normal pulses and intact distal pulses.          Radial pulses are 2+ on the right side and 2+ on the left side.       Dorsalis pedis pulses are 2+ on the right side and 2+ on the left side.       Posterior tibial pulses are 2+ on the right side and 2+ on the left side.     Heart sounds: Normal heart sounds.  Pulmonary:     Effort: Pulmonary effort is normal. No respiratory distress.     Breath sounds: Normal breath sounds.  Abdominal:     General: Bowel sounds are normal. There is no distension.  Musculoskeletal:        General: Swelling and tenderness present. No deformity or signs of injury. Normal range of motion.     Cervical back: Normal range of motion.     Right lower leg: No edema.     Left lower leg: No edema.     Comments: Diffuse tenderness to LLE, stop at mid femur. No midline back tenderness. Pelvis stable, non tender to palpation.Full ROM to BL lower extremities  Skin:    General: Skin is warm and dry.     Capillary Refill: Capillary refill takes less than 2 seconds.     Findings: Erythema and rash present.     Comments: Cellulitis starting at dorsum left foot, extending to anterior right tib/fib, streaking, warmth and moderate tenderness. No vesicles, bulla, target lesions  Neurological:     General: No focal deficit present.     Mental Status: She is alert and oriented to person, place, and time.     Comments: Ambulatory without difficulty  Psychiatric:        Mood and Affect: Mood and affect normal.    ED Results / Procedures / Treatments   Labs (all labs ordered are listed, but only abnormal results are displayed) Labs Reviewed  COMPREHENSIVE METABOLIC PANEL - Abnormal; Notable for the following components:      Result Value   Glucose, Bld 113 (*)     Calcium 8.7 (*)    All other components within normal limits  CBC WITH DIFFERENTIAL/PLATELET - Abnormal; Notable for the following components:   WBC 19.6 (*)    Neutro Abs 17.0 (*)    Monocytes Absolute 1.2 (*)    Abs Immature Granulocytes 0.18 (*)    All other components within normal limits  URINALYSIS, ROUTINE W REFLEX MICROSCOPIC - Abnormal; Notable for the following components:  pH 9.0 (*)    Ketones, ur 5 (*)    Leukocytes,Ua SMALL (*)    All other components within normal limits  RESP PANEL BY RT-PCR (FLU Armstrong&B, COVID) [id number]  CULTURE, BLOOD (ROUTINE X 2)  CULTURE, BLOOD (ROUTINE X 2)  URINE CULTURE  LACTIC ACID, PLASMA  PROTIME-INR  APTT  LACTIC ACID, PLASMA  I-STAT BETA HCG BLOOD, ED ([facility], [facility], [facility] ONLY)    EKG EKG Interpretation  Date/Time:  Saturday April 28 2020 07:39:22 EST Ventricular Rate:  108 PR Interval:    QRS Duration: 81 QT Interval:  315 QTC Calculation: 423 R Axis:   79 Text Interpretation: Sinus tachycardia Probable left atrial enlargement Borderline T abnormalities, diffuse leads Confirmed by Margarita Grizzle [phone]) on 04/28/2020 7:57:37 AM   Radiology DG Tibia/Fibula Left  Result Date: 04/28/2020 CLINICAL DATA:  Pain and redness EXAM: LEFT TIBIA AND FIBULA - 2 VIEW COMPARISON:  None. FINDINGS: Frontal and lateral views were obtained. No fracture or dislocation. No abnormal periosteal reaction. No soft tissue air or radiopaque foreign body. Joint spaces appear unremarkable. IMPRESSION: No fracture or dislocation. No bony destruction. No soft tissue air. Electronically Signed   By: Bretta Bang III M.D.   On: 04/28/2020 09:13   DG Chest Port 1 View  Result Date: 04/28/2020 CLINICAL DATA:  Cellulitis EXAM: PORTABLE CHEST 1 VIEW COMPARISON:  None. FINDINGS: Lungs are clear. Heart size and pulmonary vascularity are normal. No adenopathy. No bone lesions. IMPRESSION: Lungs clear.  Cardiac silhouette normal. Electronically Signed   By: Bretta Bang  III M.D.   On: 04/28/2020 09:11   DG Foot Complete Left  Result Date: 04/28/2020 CLINICAL DATA:  Pain and redness EXAM: LEFT FOOT - COMPLETE 3+ VIEW COMPARISON:  None. FINDINGS: Frontal, oblique, and lateral views were obtained. There is soft tissue swelling dorsally. No radiopaque foreign body or soft tissue air. There is no fracture or dislocation. No appreciable joint space narrowing or erosion. No bony destruction. There is an inferior calcaneal spur. IMPRESSION: Soft tissue swelling without soft tissue air or radiopaque foreign body. No erosive change or bony destruction. Joint spaces appear normal. No fracture or dislocation. There is an inferior calcaneal spur. Electronically Signed   By: Bretta Bang III M.D.   On: 04/28/2020 09:12   VAS [place] LOWER EXTREMITY VENOUS (DVT) (ONLY [facility] & [facility])  Result Date: 04/28/2020  Lower Venous DVT Study Indications: Edema, and Pain.  Limitations: Patient pain tolerance, unable to tolerate compressions. Comparison Study: no prior Performing Technologist: Blanch Media RVS  Examination Guidelines: Armstrong complete evaluation includes B-mode imaging, spectral Doppler, color Doppler, and power Doppler as needed of all accessible portions of each vessel. Bilateral testing is considered an integral part of Armstrong complete examination. Limited examinations for reoccurring indications may be performed as noted. The reflux portion of the exam is performed with the patient in reverse Trendelenburg.  +-----+---------------+---------+-----------+----------+--------------+ RIGHTCompressibilityPhasicitySpontaneityPropertiesThrombus Aging +-----+---------------+---------+-----------+----------+--------------+ CFV                 Yes      Yes                                 +-----+---------------+---------+-----------+----------+--------------+   +---------+---------------+---------+-----------+----------+-------------------+ LEFT      CompressibilityPhasicitySpontaneityPropertiesThrombus Aging      +---------+---------------+---------+-----------+----------+-------------------+ CFV      Full           Yes      Yes                                      +---------+---------------+---------+-----------+----------+-------------------+  SFJ      Full                                                             +---------+---------------+---------+-----------+----------+-------------------+ FV Prox                 Yes      Yes                                      +---------+---------------+---------+-----------+----------+-------------------+ FV Mid   Full                                                             +---------+---------------+---------+-----------+----------+-------------------+ FV DistalFull                                                             +---------+---------------+---------+-----------+----------+-------------------+ PFV                     Yes      Yes                                      +---------+---------------+---------+-----------+----------+-------------------+ POP      Full           Yes      Yes                                      +---------+---------------+---------+-----------+----------+-------------------+ PTV      Full                                                             +---------+---------------+---------+-----------+----------+-------------------+ PERO                                                  Not well visualized +---------+---------------+---------+-----------+----------+-------------------+     Summary: RIGHT: - No evidence of common femoral vein obstruction.  LEFT: - There is no evidence of deep vein thrombosis in the lower extremity. However, portions of this examination were limited- see technologist comments above.  - No cystic structure found in the popliteal fossa.  *See table(s) above for measurements and  observations.    Preliminary     Procedures .Critical Care Performed by: Linwood Dibbles, PA-C Authorized by: Linwood Dibbles, PA-C   Critical care provider statement:  Critical care time (minutes):  35   Critical care was necessary to treat or prevent imminent or life-threatening deterioration of the following conditions:  Sepsis   Critical care was time spent personally by me on the following activities:  Discussions with consultants, evaluation of patient's response to treatment, examination of patient, ordering and performing treatments and interventions, ordering and review of laboratory studies, ordering and review of radiographic studies, pulse oximetry, re-evaluation of patient's condition, obtaining history from patient or surrogate and review of old charts     Medications Ordered in ED Medications  lactated ringers infusion ( Intravenous New Bag/Given 04/28/20 0758)  lactated ringers bolus 1,000 mL (0 mLs Intravenous Stopped 04/28/20 0940)    And  lactated ringers bolus 800 mL (0 mLs Intravenous Stopped 04/28/20 1018)  acetaminophen (TYLENOL) tablet 650 mg (650 mg Oral Given 04/28/20 0745)  morphine 4 MG/ML injection 4 mg (4 mg Intravenous Given 04/28/20 0802)  ondansetron (ZOFRAN) injection 4 mg (4 mg Intravenous Given 04/28/20 0801)  vancomycin (VANCOREADY) IVPB 2000 mg/400 mL (2,000 mg Intravenous New Bag/Given 04/28/20 0813)    ED Course  I have reviewed the triage vital signs and the nursing notes.  Pertinent labs & imaging results that were available during my care of the patient were reviewed by me and considered in my medical decision making (see chart for details).   On arrival patient was afebrile however mild tachycardia with triage nurse.  When I evaluated patient patient febrile up to 103, tachycardia, rigors on exam.  She appears ill.  Code sepsis called.  Patient presents with left clinically appears to be cellulitis to LLE.  Started on IV fluids, antibiotics  for cellulitis.  Had resolved after I gave her Rocephin and Bactrim approximately 1 month ago however had recurrence.  At that time was seen by urgent care, started on steroids for possible gout? Did not get additional antibiotics.  Off steroids x 10 days. Patient with continued erythema, swelling, warmth.  No circumferential swelling.  Erythema to dorsum and anterior left lower extremity beginning to streak up anterior shin.  Posterior calves soft however diffusely tender.  Compartments soft. She is full range of motion with plantar flexion dorsiflexion to bilateral ankles as well as knees.  Low suspicion for gout, septic joint, hemarthrosis, acute fracture or dislocation, rhabdomyolysis, myositis.  Labs and imaging personally reviewed and interpreted:  [place] negative for DVT X-ray left foot with some soft tissue swelling however gas-forming organism, fracture, dislocation X-ray left tib-fib without any significant abnormality COVID negative Leukocytosis at 19.6, increased neutrophils Metabolic panel mild hyperglycemia to 113, no additional electrolyte, renal or liver abnormality UA negative for infection Lactic acid 1.0 Pregnancy negative  Patient reassessed.  Pain controlled.  Defervesced with Tylenol.  Still mildly tachycardic to low 100s in room.  Blood pressure noted in computer at 202/128 however my recent evaluation patient blood pressure is 124/73.  Feel prior BP likely inaccurate.  Will admit to hospitalist service for possible sepsis due to cellulitis.  Has gotten IV fluids as well as IV antibiotics here in ED.  CONSULT with Dr. Dairl Ponder with [facility] who will evaluate patient for admission.  The patient appears reasonably stabilized for admission considering the current resources, flow, and capabilities available in the ED at this time, and I doubt any other [facility] requiring further screening and/or treatment in the ED prior to admission.    MDM Rules/Calculators/Armstrong&P  Final Clinical Impression(s) / ED Diagnoses Final diagnoses:  Sepsis without acute organ dysfunction, due to unspecified organism [facility])  Cellulitis of left lower extremity    Rx / DC Orders ED Discharge Orders    None       Audrey Neubecker A, PA-C 04/28/20 1020    Margarita Grizzle, MD 04/28/20 1040

## 2020-04-28 NOTE — ED Triage Notes (Signed)
Patient here from home reporting left foot pain and right hand pain that started "weeks" ago. States that she was treated for cellulitis with no relief.

## 2020-04-28 NOTE — ED Notes (Signed)
ED TO INPATIENT HANDOFF REPORT  ED Nurse Name and Phone #: [phone]  S Name/Age/Gender Audrey Armstrong 35 y.o. female Room/Bed: WA13/WA13  Code Status   Code Status: Not on file  Home/SNF/Other Home Patient oriented to: self, place, time and situation Is this baseline? Yes   Triage Complete: Triage complete  Chief Complaint Sepsis [facility]) [A41.9]  Triage Note Patient here from home reporting left foot pain and right hand pain that started "weeks" ago. States that she was treated for cellulitis with no relief.     Allergies No Known Allergies  Level of Care/Admitting Diagnosis ED Disposition    ED Disposition Condition Comment   Admit  Hospital Area: [facility] [[id number]]  Level of Care: Med-Surg [16]  May admit patient to [facility] or [facility] if equivalent level of care is available:: Yes  Covid Evaluation: Confirmed COVID Negative  Diagnosis: Sepsis [facility]) [[id number]]  Admitting Physician: Jae Dire [[id number]]  Attending Physician: Jae Dire [[id number]]  Estimated length of stay: past midnight tomorrow  Certification:: I certify this patient will need inpatient services for at least 2 midnights       B Medical/Surgery History History reviewed. No pertinent past medical history. Past Surgical History:  Procedure Laterality Date  . TUBAL LIGATION       A IV Location/Drains/Wounds Patient Lines/Drains/Airways Status    Active Line/Drains/Airways    Name Placement date Placement time Site Days   Peripheral IV 04/28/20 Left Antecubital 04/28/20  0757  Antecubital  less than 1   Peripheral IV 04/28/20 Left Hand 04/28/20  0812  Hand  less than 1          Intake/Output Last 24 hours  Intake/Output Summary (Last 24 hours) at 04/28/2020 1020 Last data filed at 04/28/2020 1018 Gross per 24 hour  Intake 1800 ml  Output -  Net 1800 ml    Labs/Imaging Results for orders placed or performed during the hospital encounter of  04/28/20 (from the past 48 hour(s))  Lactic acid, plasma     Status: None   Collection Time: 04/28/20  7:26 AM  Result Value Ref Range   Lactic Acid, Venous 1.0 0.5 - 1.9 mmol/L    Comment: Performed at [facility], [address] [place], [place] [address]  Comprehensive metabolic panel     Status: Abnormal   Collection Time: 04/28/20  7:26 AM  Result Value Ref Range   Sodium 136 135 - 145 mmol/L   Potassium 3.7 3.5 - 5.1 mmol/L   Chloride 103 98 - 111 mmol/L   CO2 23 22 - 32 mmol/L   Glucose, Bld 113 (H) 70 - 99 mg/dL    Comment: Glucose reference range applies only to samples taken after fasting for at least 8 hours.   BUN 8 6 - 20 mg/dL   Creatinine, Ser 8.18 0.44 - 1.00 mg/dL   Calcium 8.7 (L) 8.9 - 10.3 mg/dL   Total Protein 7.6 6.5 - 8.1 g/dL   Albumin 3.8 3.5 - 5.0 g/dL   AST 20 15 - 41 U/L   ALT 24 0 - 44 U/L   Alkaline Phosphatase 53 38 - 126 U/L   Total Bilirubin 0.6 0.3 - 1.2 mg/dL   GFR, Estimated >29 >93 mL/min    Comment: (NOTE) Calculated using the CKD-EPI Creatinine Equation (2021)    Anion gap 10 5 - 15    Comment: Performed at [facility], [address] [place], [place]  [address]  CBC WITH DIFFERENTIAL     Status: Abnormal   Collection Time: 04/28/20  7:26 AM  Result Value Ref Range   WBC 19.6 (H) 4.0 - 10.5 K/uL   RBC 5.10 3.87 - 5.11 MIL/uL   Hemoglobin 14.0 12.0 - 15.0 g/dL   HCT 15.1 76.1 - 60.7 %   MCV 85.9 80.0 - 100.0 fL   MCH 27.5 26.0 - 34.0 pg   MCHC 32.0 30.0 - 36.0 g/dL   RDW 37.1 06.2 - 69.4 %   Platelets 176 150 - 400 K/uL   nRBC 0.0 0.0 - 0.2 %   Neutrophils Relative % 87 %   Neutro Abs 17.0 (H) 1.7 - 7.7 K/uL   Lymphocytes Relative 6 %   Lymphs Abs 1.2 0.7 - 4.0 K/uL   Monocytes Relative 6 %   Monocytes Absolute 1.2 (H) 0.1 - 1.0 K/uL   Eosinophils Relative 0 %   Eosinophils Absolute 0.0 0.0 - 0.5 K/uL   Basophils Relative 0 %   Basophils Absolute 0.0 0.0 - 0.1 K/uL   Immature  Granulocytes 1 %   Abs Immature Granulocytes 0.18 (H) 0.00 - 0.07 K/uL    Comment: Performed at [facility], [address], [place], [place] [address]  Protime-INR     Status: None   Collection Time: 04/28/20  7:26 AM  Result Value Ref Range   Prothrombin Time 13.3 11.4 - 15.2 seconds   INR 1.1 0.8 - 1.2    Comment: (NOTE) INR goal varies based on device and disease states. Performed at [facility], [address], [place], [place] [address]   APTT     Status: None   Collection Time: 04/28/20  7:26 AM  Result Value Ref Range   aPTT 29 24 - 36 seconds    Comment: Performed at [facility], [address] [place], [place] [address]  Urinalysis, Routine w reflex microscopic In/Out Cath Urine     Status: Abnormal   Collection Time: 04/28/20  7:26 AM  Result Value Ref Range   Color, Urine YELLOW YELLOW   APPearance CLEAR CLEAR   Specific Gravity, Urine 1.017 1.005 - 1.030   pH 9.0 (H) 5.0 - 8.0   Glucose, UA NEGATIVE NEGATIVE mg/dL   Hgb urine dipstick NEGATIVE NEGATIVE   Bilirubin Urine NEGATIVE NEGATIVE   Ketones, ur 5 (A) NEGATIVE mg/dL   Protein, ur NEGATIVE NEGATIVE mg/dL   Nitrite NEGATIVE NEGATIVE   Leukocytes,Ua SMALL (A) NEGATIVE   RBC / HPF 0-5 0 - 5 RBC/hpf   WBC, UA 0-5 0 - 5 WBC/hpf   Bacteria, UA NONE SEEN NONE SEEN   Squamous Epithelial / LPF 6-10 0 - 5   Mucus PRESENT     Comment: Performed at [facility], [address] [place], [place] [address]  Resp Panel by RT-PCR (Flu A&B, Covid) Nasopharyngeal Swab     Status: None   Collection Time: 04/28/20  7:33 AM   Specimen: Nasopharyngeal Swab; Nasopharyngeal(NP) swabs in vial transport medium  Result Value Ref Range   SARS Coronavirus 2 by RT PCR NEGATIVE NEGATIVE    Comment: (NOTE) SARS-CoV-2 target nucleic acids are NOT DETECTED.  The SARS-CoV-2 RNA is generally detectable in upper respiratory specimens during the acute phase of  infection. The lowest concentration of SARS-CoV-2 viral copies this assay can detect is 138 copies/mL. A negative result does not preclude SARS-Cov-2 infection and should not be used as the sole  basis for treatment or other patient management decisions. A negative result may occur with  improper specimen collection/handling, submission of specimen other than nasopharyngeal swab, presence of viral mutation(s) within the areas targeted by this assay, and inadequate number of viral copies(<138 copies/mL). A negative result must be combined with clinical observations, patient history, and epidemiological information. The expected result is Negative.  Fact Sheet for Patients:  BloggerCourse.com  Fact Sheet for Healthcare Providers:  SeriousBroker.it  This test is no t yet approved or cleared by the [place] FDA and  has been authorized for detection and/or diagnosis of SARS-CoV-2 by FDA under an Emergency Use Authorization (EUA). This EUA will remain  in effect (meaning this test can be used) for the duration of the COVID-19 declaration under Section 564(b)(1) of the Act, 21 U.S.C.section 360bbb-3(b)(1), unless the authorization is terminated  or revoked sooner.       Influenza A by PCR NEGATIVE NEGATIVE   Influenza B by PCR NEGATIVE NEGATIVE    Comment: (NOTE) The Xpert Xpress SARS-CoV-2/FLU/RSV plus assay is intended as an aid in the diagnosis of influenza from Nasopharyngeal swab specimens and should not be used as a sole basis for treatment. Nasal washings and aspirates are unacceptable for Xpert Xpress SARS-CoV-2/FLU/RSV testing.  Fact Sheet for Patients: BloggerCourse.com  Fact Sheet for Healthcare Providers: SeriousBroker.it  This test is not yet approved or cleared by the [place] FDA and has been authorized for detection and/or diagnosis of SARS-CoV-2 by FDA  under an Emergency Use Authorization (EUA). This EUA will remain in effect (meaning this test can be used) for the duration of the COVID-19 declaration under Section 564(b)(1) of the Act, 21 U.S.C. section 360bbb-3(b)(1), unless the authorization is terminated or revoked.  Performed at [facility], [address] [place], [place] [address]   I-Stat beta hCG blood, ED     Status: None   Collection Time: 04/28/20  8:05 AM  Result Value Ref Range   I-stat hCG, quantitative <5.0 <5 mIU/mL   Comment 3            Comment:   GEST. AGE      CONC.  (mIU/mL)   <=1 WEEK        5 - 50     2 WEEKS       50 - 500     3 WEEKS       100 - 10,000     4 WEEKS     1,000 - 30,000        FEMALE AND NON-PREGNANT FEMALE:     LESS THAN 5 mIU/mL    DG Tibia/Fibula Left  Result Date: 04/28/2020 CLINICAL DATA:  Pain and redness EXAM: LEFT TIBIA AND FIBULA - 2 VIEW COMPARISON:  None. FINDINGS: Frontal and lateral views were obtained. No fracture or dislocation. No abnormal periosteal reaction. No soft tissue air or radiopaque foreign body. Joint spaces appear unremarkable. IMPRESSION: No fracture or dislocation. No bony destruction. No soft tissue air. Electronically Signed   By: Bretta Bang III M.D.   On: 04/28/2020 09:13   DG Chest Port 1 View  Result Date: 04/28/2020 CLINICAL DATA:  Cellulitis EXAM: PORTABLE CHEST 1 VIEW COMPARISON:  None. FINDINGS: Lungs are clear. Heart size and pulmonary vascularity are normal. No adenopathy. No bone lesions. IMPRESSION: Lungs clear.  Cardiac silhouette normal. Electronically Signed   By: Bretta Bang III M.D.   On: 04/28/2020 09:11   DG Foot Complete Left  Result  Date: 04/28/2020 CLINICAL DATA:  Pain and redness EXAM: LEFT FOOT - COMPLETE 3+ VIEW COMPARISON:  None. FINDINGS: Frontal, oblique, and lateral views were obtained. There is soft tissue swelling dorsally. No radiopaque foreign body or soft tissue air. There is no fracture or  dislocation. No appreciable joint space narrowing or erosion. No bony destruction. There is an inferior calcaneal spur. IMPRESSION: Soft tissue swelling without soft tissue air or radiopaque foreign body. No erosive change or bony destruction. Joint spaces appear normal. No fracture or dislocation. There is an inferior calcaneal spur. Electronically Signed   By: Bretta BangWilliam  Woodruff III M.D.   On: 04/28/2020 09:12   VAS US LOWER EXTREMITY VENOUS (DVT) (ONLY [facility] & [facility])  Result Date: 04/28/2020  Lower Venous DVT Study Indications: Edema, and Pain.  Limitations: Patient pain tolerance, unable to tolerate compressions. Comparison Study: no prior Performing Technologist: Blanch MediaMegan Riddle RVS  Examination Guidelines: A complete evaluation includes B-mode imaging, spectral Doppler, color Doppler, and power Doppler as needed of all accessible portions of each vessel. Bilateral testing is considered an integral part of a complete examination. Limited examinations for reoccurring indications may be performed as noted. The reflux portion of the exam is performed with the patient in reverse Trendelenburg.  +-----+---------------+---------+-----------+----------+--------------+ RIGHTCompressibilityPhasicitySpontaneityPropertiesThrombus Aging +-----+---------------+---------+-----------+----------+--------------+ CFV                 Yes      Yes                                 +-----+---------------+---------+-----------+----------+--------------+   +---------+---------------+---------+-----------+----------+-------------------+ LEFT     CompressibilityPhasicitySpontaneityPropertiesThrombus Aging      +---------+---------------+---------+-----------+----------+-------------------+ CFV      Full           Yes      Yes                                      +---------+---------------+---------+-----------+----------+-------------------+ SFJ      Full                                                              +---------+---------------+---------+-----------+----------+-------------------+ FV Prox                 Yes      Yes                                      +---------+---------------+---------+-----------+----------+-------------------+ FV Mid   Full                                                             +---------+---------------+---------+-----------+----------+-------------------+ FV DistalFull                                                             +---------+---------------+---------+-----------+----------+-------------------+  PFV                     Yes      Yes                                      +---------+---------------+---------+-----------+----------+-------------------+ POP      Full           Yes      Yes                                      +---------+---------------+---------+-----------+----------+-------------------+ PTV      Full                                                             +---------+---------------+---------+-----------+----------+-------------------+ PERO                                                  Not well visualized +---------+---------------+---------+-----------+----------+-------------------+     Summary: RIGHT: - No evidence of common femoral vein obstruction.  LEFT: - There is no evidence of deep vein thrombosis in the lower extremity. However, portions of this examination were limited- see technologist comments above.  - No cystic structure found in the popliteal fossa.  *See table(s) above for measurements and observations.    Preliminary     Pending Labs Unresulted Labs (From admission, onward)          Start     Ordered   04/28/20 0705  Lactic acid, plasma  (Septic presentation on arrival (screening labs, nursing and treatment orders for obvious sepsis))  Now then every 2 hours,   STAT      04/28/20 0707   04/28/20 0705  Blood Culture (routine x 2)  (Septic presentation on arrival (screening  labs, nursing and treatment orders for obvious sepsis))  BLOOD CULTURE X 2,   STAT      04/28/20 0707   04/28/20 0705  Urine culture  (Septic presentation on arrival (screening labs, nursing and treatment orders for obvious sepsis))  ONCE - STAT,   STAT        04/28/20 0707          Vitals/Pain Today's Vitals   04/28/20 0801 04/28/20 0806 04/28/20 0830 04/28/20 1018  BP:  (!) 202/128 125/70   Pulse:  (!) 129 97   Resp:  (!) 22 18   Temp:  98.9 F (37.2 C)    TempSrc:  Oral    SpO2:  100% 98%   Weight:      Height:      PainSc: 10-Worst pain ever   5     Isolation Precautions Airborne and Contact precautions  Medications Medications  lactated ringers infusion ( Intravenous New Bag/Given 04/28/20 0758)  lactated ringers bolus 1,000 mL (0 mLs Intravenous Stopped 04/28/20 0940)    And  lactated ringers bolus 800 mL (0 mLs Intravenous Stopped 04/28/20 1018)  acetaminophen (TYLENOL) tablet 650 mg (650 mg Oral Given 04/28/20 0745)  morphine 4 MG/ML injection 4 mg (4 mg Intravenous Given 04/28/20 0802)  ondansetron (ZOFRAN) injection 4 mg (4 mg Intravenous Given 04/28/20 0801)  vancomycin (VANCOREADY) IVPB 2000 mg/400 mL (2,000 mg Intravenous New Bag/Given 04/28/20 0813)    Mobility walks Low fall risk   Focused Assessments .   R Recommendations: See Admitting Provider Note  Report given to:   Additional Notes: n/a

## 2020-04-28 NOTE — Progress Notes (Signed)
elink tracking sepsis 

## 2020-04-28 NOTE — Progress Notes (Signed)
A consult was received from an ED physician for Vancomycin per pharmacy dosing.  The patient's profile has been reviewed for ht/wt/allergies/indication/available labs.   A one time order has been placed for Vancomycin 2g.  Further antibiotics/pharmacy consults should be ordered by admitting physician if indicated.                       Thank you, Lynann Beaver PharmD, BCPS Clinical Pharmacist [facility] main pharmacy [phone] 04/28/2020 7:11 AM

## 2020-04-28 NOTE — Progress Notes (Signed)
Lower extremity venous has been completed.   Preliminary results in CV Proc.   Blanch Media 04/28/2020 8:56 AM

## 2020-04-28 NOTE — Progress Notes (Signed)
Sleeping most of the day but does arouse easily. Noted loud snoring at different times throughout the afternoon. Pt did have one episode whereby her O2 sats dropped down to 51% . Marland Kitchen Noted sleep apnea of about 5 seconds, shook pt and her sats immed went back up to low/mid 90's. HOB elevated and will cont to monitor.

## 2020-04-28 NOTE — Plan of Care (Signed)
  Problem: Clinical Measurements: Goal: Ability to avoid or minimize complications of infection will improve Outcome: Progressing   

## 2020-04-29 DIAGNOSIS — A419 Sepsis, unspecified organism: Secondary | ICD-10-CM | POA: Diagnosis not present

## 2020-04-29 LAB — URINE CULTURE

## 2020-04-29 LAB — BASIC METABOLIC PANEL
Anion gap: 7 (ref 5–15)
BUN: 6 mg/dL (ref 6–20)
CO2: 24 mmol/L (ref 22–32)
Calcium: 8.3 mg/dL — ABNORMAL LOW (ref 8.9–10.3)
Chloride: 107 mmol/L (ref 98–111)
Creatinine, Ser: 0.66 mg/dL (ref 0.44–1.00)
GFR, Estimated: >60 mL/min (ref >60)
Glucose, Bld: 104 mg/dL — ABNORMAL HIGH (ref 70–99)
Potassium: 3.6 mmol/L (ref 3.5–5.1)
Sodium: 138 mmol/L (ref 135–145)

## 2020-04-29 LAB — CBC
HCT: 41.4 % (ref 36.0–46.0)
Hemoglobin: 12.9 g/dL (ref 12.0–15.0)
MCH: 27.2 pg (ref 26.0–34.0)
MCHC: 31.2 g/dL (ref 30.0–36.0)
MCV: 87.2 fL (ref 80.0–100.0)
Platelets: 148 10*3/uL — ABNORMAL LOW (ref 150–400)
RBC: 4.75 MIL/uL (ref 3.87–5.11)
RDW: 14.7 % (ref 11.5–15.5)
WBC: 9.9 10*3/uL (ref 4.0–10.5)
nRBC: 0 % (ref 0.0–0.2)

## 2020-04-29 LAB — PROCALCITONIN: Procalcitonin: <0.1 ng/mL

## 2020-04-29 LAB — PROTIME-INR
INR: 1.1 (ref 0.8–1.2)
Prothrombin Time: 13.8 seconds (ref 11.4–15.2)

## 2020-04-29 NOTE — Progress Notes (Signed)
PROGRESS NOTE    Audrey Armstrong  QIO:[id number] DOB: Jul 26, 1985 DOA: 04/28/2020 PCP: Patient, No Pcp Per     Brief Narrative:  Audrey Armstrong is a 35 year old female with no significant past medical history who presented to the ED with left foot pain for several weeks and an episode of right hand pain/swelling yesterday which has since resolved.  Patient was initially seen in the ED on 03/31/2020 for left anterior leg pain and swelling that started as a bruise and had worsening pain and swelling since.  She also had malaise and migraines with emesis at the time and tested positive for COVID-19, she had a negative [place] for DVT.  She was given Rocephin and discharged with Bactrim for cellulitis.  However, she continued to have persistent left foot pain and was seen in an urgent care on 1/26 and was prescribed prednisone for suspected gout flare and completed prednisone 10 days ago.  She states that her symptoms resolved completely after the prednisone however yesterday her symptoms of left lower extremity pain, erythema and warmth and pain on ambulation suddenly returned.  Additionally, she had sudden new onset right hand swelling and pain without erythema which lasted the entire day but has since resolved.   New events last 24 hours / Subjective: Continues to have pain and swelling of the left lower extremity.  Afebrile last 24 hours.  Assessment & Plan:   Principal Problem:   Sepsis (HCC) Active Problems:   Cellulitis   Obesity, Class III, BMI 40-49.9 (morbid obesity) (HCC)   Sepsis secondary to left lower extremity cellulitis -Sepsis present on admission with fever, tachycardia, tachypnea, leukocytosis -DVT ultrasound negative -Continue vancomycin, cefepime -Normal lactic acid.  WBC normalized. -Blood cultures pending  Right hand pain/swelling -Resolved  Morbid obesity Estimated body mass index is 42.43 kg/m as calculated from the following:   Height as of this encounter: 5'  5" (1.651 m).   Weight as of this encounter: 115.7 kg.  DVT prophylaxis:  enoxaparin (LOVENOX) injection 40 mg Start: 04/28/20 2200  Code Status: Full Family Communication: At bedside Disposition Plan:  Status is: Inpatient  Remains inpatient appropriate because:IV treatments appropriate due to intensity of illness or inability to take PO   Dispo: The patient is from: Home              Anticipated d/c is to: Home              Anticipated d/c date is: 2 days               Patient currently is not medically stable to d/c. Remains on IV antibiotics.    Difficult to place patient No      Consultants:   None  Procedures:   None   Antimicrobials:  Anti-infectives (From admission, onward)   Start     Dose/Rate Route Frequency Ordered Stop   04/28/20 2200  ceFEPIme (MAXIPIME) 2 g in sodium chloride 0.9 % 100 mL IVPB        2 g 200 mL/hr over 30 Minutes Intravenous Every 8 hours 04/28/20 1117     04/28/20 2200  vancomycin (VANCOREADY) IVPB 1250 mg/250 mL        1,250 mg 166.7 mL/hr over 90 Minutes Intravenous Every 12 hours 04/28/20 1117     04/28/20 1045  vancomycin (VANCOCIN) IVPB 1000 mg/200 mL premix  Status:  Discontinued        1,000 mg 200 mL/hr over 60 Minutes Intravenous  Once 04/28/20 1033 04/28/20  1117   04/28/20 1045  ceFEPIme (MAXIPIME) 2 g in sodium chloride 0.9 % 100 mL IVPB        2 g 200 mL/hr over 30 Minutes Intravenous  Once 04/28/20 1033     04/28/20 0730  vancomycin (VANCOREADY) IVPB 2000 mg/400 mL        2,000 mg 200 mL/hr over 120 Minutes Intravenous  Once 04/28/20 0723 04/28/20 1032        Objective: Vitals:   04/28/20 1729 04/28/20 2033 04/29/20 0102 04/29/20 0652  BP: (!) 142/77 126/62 116/76 130/77  Pulse: 94 89 82 81  Resp: 18 20 18 18   Temp: 99 F (37.2 C) 98.7 F (37.1 C) 98.3 F (36.8 C) 98.4 F (36.9 C)  TempSrc: Oral Oral Oral Oral  SpO2: 100% 99% 98% 98%  Weight:      Height:        Intake/Output Summary (Last 24 hours)  at 04/29/2020 1004 Last data filed at 04/29/2020 0905 Gross per 24 hour  Intake 5916 ml  Output 800 ml  Net 5116 ml   Filed Weights   04/28/20 0515  Weight: 115.7 kg    Examination:  General exam: Appears calm and comfortable  Respiratory system: Clear to auscultation. Respiratory effort normal. No respiratory distress. No conversational dyspnea.  Cardiovascular system: S1 & S2 heard, RRR. No murmurs.  Gastrointestinal system: Abdomen is nondistended, soft and nontender. Normal bowel sounds heard. Central nervous system: Alert and oriented. No focal neurological deficits. Speech clear.  Skin: Left lower extremity with erythema, trace edema Psychiatry: Judgement and insight appear normal. Mood & affect appropriate.   Data Reviewed: I have personally reviewed following labs and imaging studies  CBC: Recent Labs  Lab 04/28/20 0726 04/29/20 0643  WBC 19.6* 9.9  NEUTROABS 17.0*  --   HGB 14.0 12.9  HCT 43.8 41.4  MCV 85.9 87.2  PLT 176 148*   Basic Metabolic Panel: Recent Labs  Lab 04/28/20 0726 04/29/20 0643  NA 136 138  K 3.7 3.6  CL 103 107  CO2 23 24  GLUCOSE 113* 104*  BUN 8 6  CREATININE 0.90 0.66  CALCIUM 8.7* 8.3*   GFR: Estimated Creatinine Clearance: 124.7 mL/min (by C-G formula based on SCr of 0.66 mg/dL). Liver Function Tests: Recent Labs  Lab 04/28/20 0726  AST 20  ALT 24  ALKPHOS 53  BILITOT 0.6  PROT 7.6  ALBUMIN 3.8   No results for input(s): LIPASE, AMYLASE in the last 168 hours. No results for input(s): AMMONIA in the last 168 hours. Coagulation Profile: Recent Labs  Lab 04/28/20 0726 04/29/20 0643  INR 1.1 1.1   Cardiac Enzymes: No results for input(s): CKTOTAL, CKMB, CKMBINDEX, TROPONINI in the last 168 hours. BNP (last 3 results) No results for input(s): PROBNP in the last 8760 hours. HbA1C: No results for input(s): HGBA1C in the last 72 hours. CBG: No results for input(s): GLUCAP in the last 168 hours. Lipid Profile: No  results for input(s): CHOL, HDL, LDLCALC, TRIG, CHOLHDL, LDLDIRECT in the last 72 hours. Thyroid Function Tests: No results for input(s): TSH, T4TOTAL, FREET4, T3FREE, THYROIDAB in the last 72 hours. Anemia Panel: No results for input(s): VITAMINB12, FOLATE, FERRITIN, TIBC, IRON, RETICCTPCT in the last 72 hours. Sepsis Labs: Recent Labs  Lab 04/28/20 0726 04/28/20 1151 04/29/20 0643  PROCALCITON  --   --  <0.10  LATICACIDVEN 1.0 0.8  --     Recent Results (from the past 240 hour(s))  Urine culture  Status: Abnormal   Collection Time: 04/28/20  7:26 AM   Specimen: In/Out Cath Urine  Result Value Ref Range Status   Specimen Description   Final    IN/OUT CATH URINE Performed at [facility], [address], [place], [place] [address]    Special Requests   Final    NONE Performed at [facility], [address] [place], [place] [address]    Culture MULTIPLE SPECIES PRESENT, SUGGEST RECOLLECTION (A)  Final   Report Status 04/29/2020 FINAL  Final  Resp Panel by RT-PCR (Flu A&B, Covid) Nasopharyngeal Swab     Status: None   Collection Time: 04/28/20  7:33 AM   Specimen: Nasopharyngeal Swab; Nasopharyngeal(NP) swabs in vial transport medium  Result Value Ref Range Status   SARS Coronavirus 2 by RT PCR NEGATIVE NEGATIVE Final    Comment: (NOTE) SARS-CoV-2 target nucleic acids are NOT DETECTED.  The SARS-CoV-2 RNA is generally detectable in upper respiratory specimens during the acute phase of infection. The lowest concentration of SARS-CoV-2 viral copies this assay can detect is 138 copies/mL. A negative result does not preclude SARS-Cov-2 infection and should not be used as the sole basis for treatment or other patient management decisions. A negative result may occur with  improper specimen collection/handling, submission of specimen other than nasopharyngeal swab, presence of viral mutation(s) within the areas targeted by this assay,  and inadequate number of viral copies(<138 copies/mL). A negative result must be combined with clinical observations, patient history, and epidemiological information. The expected result is Negative.  Fact Sheet for Patients:  BloggerCourse.com  Fact Sheet for Healthcare Providers:  SeriousBroker.it  This test is no t yet approved or cleared by the [place] FDA and  has been authorized for detection and/or diagnosis of SARS-CoV-2 by FDA under an Emergency Use Authorization (EUA). This EUA will remain  in effect (meaning this test can be used) for the duration of the COVID-19 declaration under Section 564(b)(1) of the Act, 21 U.S.C.section 360bbb-3(b)(1), unless the authorization is terminated  or revoked sooner.       Influenza A by PCR NEGATIVE NEGATIVE Final   Influenza B by PCR NEGATIVE NEGATIVE Final    Comment: (NOTE) The Xpert Xpress SARS-CoV-2/FLU/RSV plus assay is intended as an aid in the diagnosis of influenza from Nasopharyngeal swab specimens and should not be used as a sole basis for treatment. Nasal washings and aspirates are unacceptable for Xpert Xpress SARS-CoV-2/FLU/RSV testing.  Fact Sheet for Patients: BloggerCourse.com  Fact Sheet for Healthcare Providers: SeriousBroker.it  This test is not yet approved or cleared by the [place] FDA and has been authorized for detection and/or diagnosis of SARS-CoV-2 by FDA under an Emergency Use Authorization (EUA). This EUA will remain in effect (meaning this test can be used) for the duration of the COVID-19 declaration under Section 564(b)(1) of the Act, 21 U.S.C. section 360bbb-3(b)(1), unless the authorization is terminated or revoked.  Performed at [facility], [address] [place], [place] [address]       Radiology Studies: DG Tibia/Fibula Left  Result Date:  04/28/2020 CLINICAL DATA:  Pain and redness EXAM: LEFT TIBIA AND FIBULA - 2 VIEW COMPARISON:  None. FINDINGS: Frontal and lateral views were obtained. No fracture or dislocation. No abnormal periosteal reaction. No soft tissue air or radiopaque foreign body. Joint spaces appear unremarkable. IMPRESSION: No fracture or dislocation. No bony destruction. No soft tissue air. Electronically Signed   By: Bretta Bang III M.D.  On: 04/28/2020 09:13   DG Chest Port 1 View  Result Date: 04/28/2020 CLINICAL DATA:  Cellulitis EXAM: PORTABLE CHEST 1 VIEW COMPARISON:  None. FINDINGS: Lungs are clear. Heart size and pulmonary vascularity are normal. No adenopathy. No bone lesions. IMPRESSION: Lungs clear.  Cardiac silhouette normal. Electronically Signed   By: Bretta Bang III M.D.   On: 04/28/2020 09:11   DG Foot Complete Left  Result Date: 04/28/2020 CLINICAL DATA:  Pain and redness EXAM: LEFT FOOT - COMPLETE 3+ VIEW COMPARISON:  None. FINDINGS: Frontal, oblique, and lateral views were obtained. There is soft tissue swelling dorsally. No radiopaque foreign body or soft tissue air. There is no fracture or dislocation. No appreciable joint space narrowing or erosion. No bony destruction. There is an inferior calcaneal spur. IMPRESSION: Soft tissue swelling without soft tissue air or radiopaque foreign body. No erosive change or bony destruction. Joint spaces appear normal. No fracture or dislocation. There is an inferior calcaneal spur. Electronically Signed   By: Bretta Bang III M.D.   On: 04/28/2020 09:12   VAS [place] LOWER EXTREMITY VENOUS (DVT) (ONLY [facility] & [facility])  Result Date: 04/28/2020  Lower Venous DVT Study Indications: Edema, and Pain.  Limitations: Patient pain tolerance, unable to tolerate compressions. Comparison Study: no prior Performing Technologist: Blanch Media RVS  Examination Guidelines: A complete evaluation includes B-mode imaging, spectral Doppler, color Doppler, and power Doppler as  needed of all accessible portions of each vessel. Bilateral testing is considered an integral part of a complete examination. Limited examinations for reoccurring indications may be performed as noted. The reflux portion of the exam is performed with the patient in reverse Trendelenburg.  +-----+---------------+---------+-----------+----------+--------------+ RIGHTCompressibilityPhasicitySpontaneityPropertiesThrombus Aging +-----+---------------+---------+-----------+----------+--------------+ CFV                 Yes      Yes                                 +-----+---------------+---------+-----------+----------+--------------+   +---------+---------------+---------+-----------+----------+-------------------+ LEFT     CompressibilityPhasicitySpontaneityPropertiesThrombus Aging      +---------+---------------+---------+-----------+----------+-------------------+ CFV      Full           Yes      Yes                                      +---------+---------------+---------+-----------+----------+-------------------+ SFJ      Full                                                             +---------+---------------+---------+-----------+----------+-------------------+ FV Prox                 Yes      Yes                                      +---------+---------------+---------+-----------+----------+-------------------+ FV Mid   Full                                                             +---------+---------------+---------+-----------+----------+-------------------+  FV DistalFull                                                             +---------+---------------+---------+-----------+----------+-------------------+ PFV                     Yes      Yes                                      +---------+---------------+---------+-----------+----------+-------------------+ POP      Full           Yes      Yes                                       +---------+---------------+---------+-----------+----------+-------------------+ PTV      Full                                                             +---------+---------------+---------+-----------+----------+-------------------+ PERO                                                  Not well visualized +---------+---------------+---------+-----------+----------+-------------------+     Summary: RIGHT: - No evidence of common femoral vein obstruction.  LEFT: - There is no evidence of deep vein thrombosis in the lower extremity. However, portions of this examination were limited- see technologist comments above.  - No cystic structure found in the popliteal fossa.  *See table(s) above for measurements and observations. Electronically signed by Coral Else MD on 04/28/2020 at 7:58:32 PM.    Final       Scheduled Meds: . enoxaparin (LOVENOX) injection  40 mg Subcutaneous Q24H  . sodium chloride flush  3 mL Intravenous Q12H   Continuous Infusions: . ceFEPime (MAXIPIME) IV    . ceFEPime (MAXIPIME) IV 2 g (04/29/20 0508)  . vancomycin 1,250 mg (04/29/20 0905)     LOS: 1 day      Time spent: 25 minutes   Noralee Stain, DO [facility] 04/29/2020, 10:04 AM   Available via Epic secure chat 7am-7pm After these hours, please refer to coverage provider listed on [email or web address]

## 2020-04-30 DIAGNOSIS — A419 Sepsis, unspecified organism: Secondary | ICD-10-CM | POA: Diagnosis not present

## 2020-04-30 LAB — CBC
HCT: 43.3 % (ref 36.0–46.0)
Hemoglobin: 13.4 g/dL (ref 12.0–15.0)
MCH: 27.1 pg (ref 26.0–34.0)
MCHC: 30.9 g/dL (ref 30.0–36.0)
MCV: 87.5 fL (ref 80.0–100.0)
Platelets: 160 10*3/uL (ref 150–400)
RBC: 4.95 MIL/uL (ref 3.87–5.11)
RDW: 15 % (ref 11.5–15.5)
WBC: 7.4 10*3/uL (ref 4.0–10.5)
nRBC: 0 % (ref 0.0–0.2)

## 2020-04-30 LAB — BASIC METABOLIC PANEL
Anion gap: 9 (ref 5–15)
BUN: 7 mg/dL (ref 6–20)
CO2: 24 mmol/L (ref 22–32)
Calcium: 8.3 mg/dL — ABNORMAL LOW (ref 8.9–10.3)
Chloride: 106 mmol/L (ref 98–111)
Creatinine, Ser: 0.68 mg/dL (ref 0.44–1.00)
GFR, Estimated: >60 mL/min (ref >60)
Glucose, Bld: 89 mg/dL (ref 70–99)
Potassium: 4 mmol/L (ref 3.5–5.1)
Sodium: 139 mmol/L (ref 135–145)

## 2020-04-30 NOTE — Progress Notes (Signed)
PROGRESS NOTE    Audrey Armstrong  UYQ:[id number] DOB: 31-Jul-1985 DOA: 04/28/2020 PCP: Patient, No Pcp Per     Brief Narrative:  Audrey Armstrong is a 35 year old female with no significant past medical history who presented to the ED with left foot pain for several weeks and an episode of right hand pain/swelling yesterday which has since resolved.  Patient was initially seen in the ED on 03/31/2020 for left anterior leg pain and swelling that started as a bruise and had worsening pain and swelling since.  She also had malaise and migraines with emesis at the time and tested positive for COVID-19, she had a negative [place] for DVT.  She was given Rocephin and discharged with Bactrim for cellulitis.  However, she continued to have persistent left foot pain and was seen in an urgent care on 1/26 and was prescribed prednisone for suspected gout flare and completed prednisone 10 days ago.  She states that her symptoms resolved completely after the prednisone however yesterday her symptoms of left lower extremity pain, erythema and warmth and pain on ambulation suddenly returned.  Additionally, she had sudden new onset right hand swelling and pain without erythema which lasted the entire day but has since resolved.   New events last 24 hours / Subjective: States that there has not been any change to pain and swelling of the left lower extremity.  Assessment & Plan:   Principal Problem:   Sepsis (HCC) Active Problems:   Cellulitis   Obesity, Class III, BMI 40-49.9 (morbid obesity) (HCC)   Sepsis secondary to left lower extremity cellulitis -Sepsis present on admission with fever, tachycardia, tachypnea, leukocytosis -DVT ultrasound negative -Continue cefepime -Normal lactic acid.  WBC normalized. -Blood cultures negative to date  Right hand pain/swelling -Resolved  Morbid obesity Estimated body mass index is 42.43 kg/m as calculated from the following:   Height as of this encounter: 5'  5" (1.651 m).   Weight as of this encounter: 115.7 kg.  DVT prophylaxis:  enoxaparin (LOVENOX) injection 40 mg Start: 04/28/20 2200  Code Status: Full Family Communication: No family at bedside Disposition Plan:  Status is: Inpatient  Remains inpatient appropriate because:IV treatments appropriate due to intensity of illness or inability to take PO   Dispo: The patient is from: Home              Anticipated d/c is to: Home              Anticipated d/c date is: 2 days               Patient currently is not medically stable to d/c. Remains on IV antibiotics.    Difficult to place patient No      Consultants:   None  Procedures:   None   Antimicrobials:  Anti-infectives (From admission, onward)   Start     Dose/Rate Route Frequency Ordered Stop   04/28/20 2200  ceFEPIme (MAXIPIME) 2 g in sodium chloride 0.9 % 100 mL IVPB        2 g 200 mL/hr over 30 Minutes Intravenous Every 8 hours 04/28/20 1117     04/28/20 2200  vancomycin (VANCOREADY) IVPB 1250 mg/250 mL  Status:  Discontinued        1,250 mg 166.7 mL/hr over 90 Minutes Intravenous Every 12 hours 04/28/20 1117 04/29/20 1225   04/28/20 1045  vancomycin (VANCOCIN) IVPB 1000 mg/200 mL premix  Status:  Discontinued        1,000 mg 200 mL/hr over  60 Minutes Intravenous  Once 04/28/20 1033 04/28/20 1117   04/28/20 1045  ceFEPIme (MAXIPIME) 2 g in sodium chloride 0.9 % 100 mL IVPB        2 g 200 mL/hr over 30 Minutes Intravenous  Once 04/28/20 1033     04/28/20 0730  vancomycin (VANCOREADY) IVPB 2000 mg/400 mL        2,000 mg 200 mL/hr over 120 Minutes Intravenous  Once 04/28/20 0723 04/28/20 1032       Objective: Vitals:   04/29/20 1416 04/29/20 1747 04/29/20 2121 04/30/20 0512  BP: 118/86 (!) 156/88 133/75 140/88  Pulse: 76 73 74 66  Resp: 18 16 19 18   Temp: 98.1 F (36.7 C)  99 F (37.2 C) 98.6 F (37 C)  TempSrc: Oral  Oral Oral  SpO2: 100% 96% 99% 91%  Weight:      Height:        Intake/Output  Summary (Last 24 hours) at 04/30/2020 1037 Last data filed at 04/30/2020 1000 Gross per 24 hour  Intake 2113.02 ml  Output 1400 ml  Net 713.02 ml   Filed Weights   04/28/20 0515  Weight: 115.7 kg    Examination: General exam: Appears calm and comfortable  Respiratory system: Clear to auscultation. Respiratory effort normal. Cardiovascular system: S1 & S2 heard, RRR. No pedal edema. Gastrointestinal system: Abdomen is nondistended, soft and nontender. Normal bowel sounds heard. Central nervous system: Alert and oriented. Non focal exam. Speech clear  Extremities: Symmetric in appearance bilaterally  Skin: Left lower extremity with erythema and trace edema Psychiatry: Judgement and insight appear stable. Mood & affect appropriate.   Data Reviewed: I have personally reviewed following labs and imaging studies  CBC: Recent Labs  Lab 04/28/20 0726 04/29/20 0643 04/30/20 0518  WBC 19.6* 9.9 7.4  NEUTROABS 17.0*  --   --   HGB 14.0 12.9 13.4  HCT 43.8 41.4 43.3  MCV 85.9 87.2 87.5  PLT 176 148* 160   Basic Metabolic Panel: Recent Labs  Lab 04/28/20 0726 04/29/20 0643 04/30/20 0518  NA 136 138 139  K 3.7 3.6 4.0  CL 103 107 106  CO2 23 24 24   GLUCOSE 113* 104* 89  BUN 8 6 7   CREATININE 0.90 0.66 0.68  CALCIUM 8.7* 8.3* 8.3*   GFR: Estimated Creatinine Clearance: 124.7 mL/min (by C-G formula based on SCr of 0.68 mg/dL). Liver Function Tests: Recent Labs  Lab 04/28/20 0726  AST 20  ALT 24  ALKPHOS 53  BILITOT 0.6  PROT 7.6  ALBUMIN 3.8   No results for input(s): LIPASE, AMYLASE in the last 168 hours. No results for input(s): AMMONIA in the last 168 hours. Coagulation Profile: Recent Labs  Lab 04/28/20 0726 04/29/20 0643  INR 1.1 1.1   Cardiac Enzymes: No results for input(s): CKTOTAL, CKMB, CKMBINDEX, TROPONINI in the last 168 hours. BNP (last 3 results) No results for input(s): PROBNP in the last 8760 hours. HbA1C: No results for input(s): HGBA1C in  the last 72 hours. CBG: No results for input(s): GLUCAP in the last 168 hours. Lipid Profile: No results for input(s): CHOL, HDL, LDLCALC, TRIG, CHOLHDL, LDLDIRECT in the last 72 hours. Thyroid Function Tests: No results for input(s): TSH, T4TOTAL, FREET4, T3FREE, THYROIDAB in the last 72 hours. Anemia Panel: No results for input(s): VITAMINB12, FOLATE, FERRITIN, TIBC, IRON, RETICCTPCT in the last 72 hours. Sepsis Labs: Recent Labs  Lab 04/28/20 0726 04/28/20 1151 04/29/20 0643  PROCALCITON  --   --  <0.10  LATICACIDVEN 1.0 0.8  --     Recent Results (from the past 240 hour(s))  Blood Culture (routine x 2)     Status: None (Preliminary result)   Collection Time: 04/28/20  7:26 AM   Specimen: BLOOD  Result Value Ref Range Status   Specimen Description   Final    BLOOD BLOOD LEFT HAND Performed at [facility], [address] [place], [place] [address]    Special Requests   Final    BOTTLES DRAWN AEROBIC AND ANAEROBIC Blood Culture adequate volume Performed at [facility], [address], [place], [place] [address]    Culture   Final    NO GROWTH 1 DAY Performed at [facility], [address] [place], [place] [address]    Report Status PENDING  Incomplete  Urine culture     Status: Abnormal   Collection Time: 04/28/20  7:26 AM   Specimen: In/Out Cath Urine  Result Value Ref Range Status   Specimen Description   Final    IN/OUT CATH URINE Performed at [facility] [place], [address], [place], [place] [address]    Special Requests   Final    NONE Performed at [facility], [address] [place], [place] [address]    Culture MULTIPLE SPECIES PRESENT, SUGGEST RECOLLECTION (A)  Final   Report Status 04/29/2020 FINAL  Final  Resp Panel by RT-PCR (Flu A&B, Covid) Nasopharyngeal Swab     Status: None   Collection Time: 04/28/20  7:33 AM   Specimen: Nasopharyngeal Swab; Nasopharyngeal(NP)  swabs in vial transport medium  Result Value Ref Range Status   SARS Coronavirus 2 by RT PCR NEGATIVE NEGATIVE Final    Comment: (NOTE) SARS-CoV-2 target nucleic acids are NOT DETECTED.  The SARS-CoV-2 RNA is generally detectable in upper respiratory specimens during the acute phase of infection. The lowest concentration of SARS-CoV-2 viral copies this assay can detect is 138 copies/mL. A negative result does not preclude SARS-Cov-2 infection and should not be used as the sole basis for treatment or other patient management decisions. A negative result may occur with  improper specimen collection/handling, submission of specimen other than nasopharyngeal swab, presence of viral mutation(s) within the areas targeted by this assay, and inadequate number of viral copies(<138 copies/mL). A negative result must be combined with clinical observations, patient history, and epidemiological information. The expected result is Negative.  Fact Sheet for Patients:  BloggerCourse.com  Fact Sheet for Healthcare Providers:  SeriousBroker.it  This test is no t yet approved or cleared by the [place] FDA and  has been authorized for detection and/or diagnosis of SARS-CoV-2 by FDA under an Emergency Use Authorization (EUA). This EUA will remain  in effect (meaning this test can be used) for the duration of the COVID-19 declaration under Section 564(b)(1) of the Act, 21 U.S.C.section 360bbb-3(b)(1), unless the authorization is terminated  or revoked sooner.       Influenza A by PCR NEGATIVE NEGATIVE Final   Influenza B by PCR NEGATIVE NEGATIVE Final    Comment: (NOTE) The Xpert Xpress SARS-CoV-2/FLU/RSV plus assay is intended as an aid in the diagnosis of influenza from Nasopharyngeal swab specimens and should not be used as a sole basis for treatment. Nasal washings and aspirates are unacceptable for Xpert Xpress  SARS-CoV-2/FLU/RSV testing.  Fact Sheet for Patients: BloggerCourse.com  Fact Sheet for Healthcare Providers: SeriousBroker.it  This test is not yet approved or cleared by the [place]  FDA and has been authorized for detection and/or diagnosis of SARS-CoV-2 by FDA under an Emergency Use Authorization (EUA). This EUA will remain in effect (meaning this test can be used) for the duration of the COVID-19 declaration under Section 564(b)(1) of the Act, 21 U.S.C. section 360bbb-3(b)(1), unless the authorization is terminated or revoked.  Performed at [facility], [address] [place], [place] [address]   Blood Culture (routine x 2)     Status: None (Preliminary result)   Collection Time: 04/28/20  7:36 AM   Specimen: BLOOD  Result Value Ref Range Status   Specimen Description   Final    BLOOD LEFT ANTECUBITAL Performed at [facility], [address] [place], [place] [address]    Special Requests   Final    BOTTLES DRAWN AEROBIC AND ANAEROBIC Blood Culture results may not be optimal due to an excessive volume of blood received in culture bottles Performed at [facility], [address] [place], [place] [address]    Culture   Final    NO GROWTH 1 DAY Performed at [facility], [address] [place], [place] [address]    Report Status PENDING  Incomplete      Radiology Studies: No results found.    Scheduled Meds: . enoxaparin (LOVENOX) injection  40 mg Subcutaneous Q24H  . sodium chloride flush  3 mL Intravenous Q12H   Continuous Infusions: . ceFEPime (MAXIPIME) IV    . ceFEPime (MAXIPIME) IV 2 g (04/30/20 0515)     LOS: 2 days      Time spent: 20 minutes   Noralee StainJennifer Zaccheaus Storlie, DO [facility] 04/30/2020, 10:37 AM   Available via Epic secure chat 7am-7pm After these hours, please refer to coverage provider listed on [email or web address]

## 2020-05-01 DIAGNOSIS — A419 Sepsis, unspecified organism: Secondary | ICD-10-CM | POA: Diagnosis not present

## 2020-05-01 MED ORDER — CEFAZOLIN SODIUM-DEXTROSE 2-4 GM/100ML-% IV SOLN
2.0000 g | Freq: Three times a day (TID) | INTRAVENOUS | Status: DC
  Administered 2020-05-01 – 2020-05-02 (×3): 2 g via INTRAVENOUS
  Filled 2020-05-01 (×4): qty 100

## 2020-05-01 NOTE — Progress Notes (Signed)
PROGRESS NOTE    Audrey Armstrong  FWY:[id number] DOB: [birth year] DOA: 04/28/2020 PCP: Patient, No Pcp Per     Brief Narrative:  Audrey Armstrong is a 35 year old female with no significant past medical history who presented to the ED with left foot pain for several weeks and an episode of right hand pain/swelling yesterday which has since resolved.  Patient was initially seen in the ED on 03/31/2020 for left anterior leg pain and swelling that started as a bruise and had worsening pain and swelling since.  She also had malaise and migraines with emesis at the time and tested positive for COVID-19, she had a negative [place] for DVT.  She was given Rocephin and discharged with Bactrim for cellulitis.  However, she continued to have persistent left foot pain and was seen in an urgent care on 1/26 and was prescribed prednisone for suspected gout flare and completed prednisone 10 days ago.  She states that her symptoms resolved completely after the prednisone however yesterday her symptoms of left lower extremity pain, erythema and warmth and pain on ambulation suddenly returned.  Additionally, she had sudden new onset right hand swelling and pain without erythema which lasted the entire day but has since resolved.   New events last 24 hours / Subjective: Some improvement of swelling in her left foot, continued erythema and pain  Assessment & Plan:   Principal Problem:   Sepsis (HCC) Active Problems:   Cellulitis   Obesity, Class III, BMI 40-49.9 (morbid obesity) (HCC)   Sepsis secondary to left lower extremity cellulitis -Sepsis present on admission with fever, tachycardia, tachypnea, leukocytosis -DVT ultrasound negative -Blood cultures negative to date -Normal lactic acid.  WBC normalized. -Cefepime --> Ancef  Right hand pain/swelling -Resolved  Morbid obesity Estimated body mass index is 42.43 kg/m as calculated from the following:   Height as of this encounter: 5\' 5"  (1.651 m).    Weight as of this encounter: 115.7 kg.  DVT prophylaxis:  enoxaparin (LOVENOX) injection 40 mg Start: 04/28/20 2200  Code Status: Full Family Communication: No family at bedside Disposition Plan:  Status is: Inpatient  Remains inpatient appropriate because:IV treatments appropriate due to intensity of illness or inability to take PO   Dispo: The patient is from: Home              Anticipated d/c is to: Home              Anticipated d/c date is: 1 day               Patient currently is not medically stable to d/c. Remains on IV antibiotics.    Difficult to place patient No      Consultants:   None  Procedures:   None   Antimicrobials:  Anti-infectives (From admission, onward)   Start     Dose/Rate Route Frequency Ordered Stop   05/01/20 1400  ceFAZolin (ANCEF) IVPB 2g/100 mL premix        2 g 200 mL/hr over 30 Minutes Intravenous Every 8 hours 05/01/20 1036     04/28/20 2200  ceFEPIme (MAXIPIME) 2 g in sodium chloride 0.9 % 100 mL IVPB  Status:  Discontinued        2 g 200 mL/hr over 30 Minutes Intravenous Every 8 hours 04/28/20 1117 05/01/20 1036   04/28/20 2200  vancomycin (VANCOREADY) IVPB 1250 mg/250 mL  Status:  Discontinued        1,250 mg 166.7 mL/hr over 90 Minutes Intravenous Every 12  hours 04/28/20 1117 04/29/20 1225   04/28/20 1045  vancomycin (VANCOCIN) IVPB 1000 mg/200 mL premix  Status:  Discontinued        1,000 mg 200 mL/hr over 60 Minutes Intravenous  Once 04/28/20 1033 04/28/20 1117   04/28/20 1045  ceFEPIme (MAXIPIME) 2 g in sodium chloride 0.9 % 100 mL IVPB  Status:  Discontinued        2 g 200 mL/hr over 30 Minutes Intravenous  Once 04/28/20 1033 05/01/20 1036   04/28/20 0730  vancomycin (VANCOREADY) IVPB 2000 mg/400 mL        2,000 mg 200 mL/hr over 120 Minutes Intravenous  Once 04/28/20 0723 04/28/20 1032       Objective: Vitals:   04/30/20 0512 04/30/20 1434 04/30/20 2023 05/01/20 0523  BP: 140/88 (!) 134/95 127/74 125/71  Pulse: 66  75 72 87  Resp: 18 19 18 18   Temp: 98.6 F (37 C) 98.5 F (36.9 C) 98.3 F (36.8 C) 98.4 F (36.9 C)  TempSrc: Oral Oral Oral Oral  SpO2: 91% 100% 98% 97%  Weight:      Height:        Intake/Output Summary (Last 24 hours) at 05/01/2020 1049 Last data filed at 05/01/2020 1000 Gross per 24 hour  Intake 1959.36 ml  Output --  Net 1959.36 ml   Filed Weights   04/28/20 0515  Weight: 115.7 kg   Examination: General exam: Appears calm and comfortable  Respiratory system: Clear to auscultation. Respiratory effort normal. Cardiovascular system: S1 & S2 heard, RRR.  Gastrointestinal system: Abdomen is nondistended, soft and nontender. Normal bowel sounds heard. Central nervous system: Alert and oriented. Non focal exam. Speech clear  Extremities: Symmetric in appearance bilaterally  Skin: Left lower extremity with trace edema and erythema, mild improved in appearance from yesterday's exam Psychiatry: Judgement and insight appear stable. Mood & affect appropriate.   Data Reviewed: I have personally reviewed following labs and imaging studies  CBC: Recent Labs  Lab 04/28/20 0726 04/29/20 0643 04/30/20 0518  WBC 19.6* 9.9 7.4  NEUTROABS 17.0*  --   --   HGB 14.0 12.9 13.4  HCT 43.8 41.4 43.3  MCV 85.9 87.2 87.5  PLT 176 148* 160   Basic Metabolic Panel: Recent Labs  Lab 04/28/20 0726 04/29/20 0643 04/30/20 0518  NA 136 138 139  K 3.7 3.6 4.0  CL 103 107 106  CO2 23 24 24   GLUCOSE 113* 104* 89  BUN 8 6 7   CREATININE 0.90 0.66 0.68  CALCIUM 8.7* 8.3* 8.3*   GFR: Estimated Creatinine Clearance: 124.7 mL/min (by C-G formula based on SCr of 0.68 mg/dL). Liver Function Tests: Recent Labs  Lab 04/28/20 0726  AST 20  ALT 24  ALKPHOS 53  BILITOT 0.6  PROT 7.6  ALBUMIN 3.8   No results for input(s): LIPASE, AMYLASE in the last 168 hours. No results for input(s): AMMONIA in the last 168 hours. Coagulation Profile: Recent Labs  Lab 04/28/20 0726 04/29/20 0643   INR 1.1 1.1   Cardiac Enzymes: No results for input(s): CKTOTAL, CKMB, CKMBINDEX, TROPONINI in the last 168 hours. BNP (last 3 results) No results for input(s): PROBNP in the last 8760 hours. HbA1C: No results for input(s): HGBA1C in the last 72 hours. CBG: No results for input(s): GLUCAP in the last 168 hours. Lipid Profile: No results for input(s): CHOL, HDL, LDLCALC, TRIG, CHOLHDL, LDLDIRECT in the last 72 hours. Thyroid Function Tests: No results for input(s): TSH, T4TOTAL, FREET4, T3FREE, THYROIDAB  in the last 72 hours. Anemia Panel: No results for input(s): VITAMINB12, FOLATE, FERRITIN, TIBC, IRON, RETICCTPCT in the last 72 hours. Sepsis Labs: Recent Labs  Lab 04/28/20 0726 04/28/20 1151 04/29/20 0643  PROCALCITON  --   --  <0.10  LATICACIDVEN 1.0 0.8  --     Recent Results (from the past 240 hour(s))  Blood Culture (routine x 2)     Status: None (Preliminary result)   Collection Time: 04/28/20  7:26 AM   Specimen: BLOOD  Result Value Ref Range Status   Specimen Description   Final    BLOOD BLOOD LEFT HAND Performed at [facility], [address] [place], [place] [address]    Special Requests   Final    BOTTLES DRAWN AEROBIC AND ANAEROBIC Blood Culture adequate volume Performed at [facility], [address], [place], [place] [address]    Culture   Final    NO GROWTH 2 DAYS Performed at [facility], [address], [place], [place] [address]    Report Status PENDING  Incomplete  Urine culture     Status: Abnormal   Collection Time: 04/28/20  7:26 AM   Specimen: In/Out Cath Urine  Result Value Ref Range Status   Specimen Description   Final    IN/OUT CATH URINE Performed at [facility], [address], [place], [place] [address]    Special Requests   Final    NONE Performed at [facility], [address], [place], [place] [address]    Culture MULTIPLE SPECIES PRESENT,  SUGGEST RECOLLECTION (A)  Final   Report Status 04/29/2020 FINAL  Final  Resp Panel by RT-PCR (Flu A&B, Covid) Nasopharyngeal Swab     Status: None   Collection Time: 04/28/20  7:33 AM   Specimen: Nasopharyngeal Swab; Nasopharyngeal(NP) swabs in vial transport medium  Result Value Ref Range Status   SARS Coronavirus 2 by RT PCR NEGATIVE NEGATIVE Final    Comment: (NOTE) SARS-CoV-2 target nucleic acids are NOT DETECTED.  The SARS-CoV-2 RNA is generally detectable in upper respiratory specimens during the acute phase of infection. The lowest concentration of SARS-CoV-2 viral copies this assay can detect is 138 copies/mL. A negative result does not preclude SARS-Cov-2 infection and should not be used as the sole basis for treatment or other patient management decisions. A negative result may occur with  improper specimen collection/handling, submission of specimen other than nasopharyngeal swab, presence of viral mutation(s) within the areas targeted by this assay, and inadequate number of viral copies(<138 copies/mL). A negative result must be combined with clinical observations, patient history, and epidemiological information. The expected result is Negative.  Fact Sheet for Patients:  BloggerCourse.com  Fact Sheet for Healthcare Providers:  SeriousBroker.it  This test is no t yet approved or cleared by the [place] FDA and  has been authorized for detection and/or diagnosis of SARS-CoV-2 by FDA under an Emergency Use Authorization (EUA). This EUA will remain  in effect (meaning this test can be used) for the duration of the COVID-19 declaration under Section 564(b)(1) of the Act, 21 U.S.C.section 360bbb-3(b)(1), unless the authorization is terminated  or revoked sooner.       Influenza A by PCR NEGATIVE NEGATIVE Final   Influenza B by PCR NEGATIVE NEGATIVE Final    Comment: (NOTE) The Xpert Xpress SARS-CoV-2/FLU/RSV plus  assay is intended as an aid in the diagnosis of influenza from Nasopharyngeal swab specimens and should not be  used as a sole basis for treatment. Nasal washings and aspirates are unacceptable for Xpert Xpress SARS-CoV-2/FLU/RSV testing.  Fact Sheet for Patients: BloggerCourse.com  Fact Sheet for Healthcare Providers: SeriousBroker.it  This test is not yet approved or cleared by the [place] FDA and has been authorized for detection and/or diagnosis of SARS-CoV-2 by FDA under an Emergency Use Authorization (EUA). This EUA will remain in effect (meaning this test can be used) for the duration of the COVID-19 declaration under Section 564(b)(1) of the Act, 21 U.S.C. section 360bbb-3(b)(1), unless the authorization is terminated or revoked.  Performed at [facility], [address] [place], [place] [address]   Blood Culture (routine x 2)     Status: None (Preliminary result)   Collection Time: 04/28/20  7:36 AM   Specimen: BLOOD  Result Value Ref Range Status   Specimen Description   Final    BLOOD LEFT ANTECUBITAL Performed at [facility], [address], [place], [place] [address]    Special Requests   Final    BOTTLES DRAWN AEROBIC AND ANAEROBIC Blood Culture results may not be optimal due to an excessive volume of blood received in culture bottles Performed at [facility], [address] [place], [place] [address]    Culture   Final    NO GROWTH 2 DAYS Performed at [facility], [address], [place], [place] [address]    Report Status PENDING  Incomplete      Radiology Studies: No results found.    Scheduled Meds: . enoxaparin (LOVENOX) injection  40 mg Subcutaneous Q24H  . sodium chloride flush  3 mL Intravenous Q12H   Continuous Infusions: .  ceFAZolin (ANCEF) IV       LOS: 3 days      Time spent: 20 minutes   Noralee Stain,  DO [facility] 05/01/2020, 10:49 AM   Available via Epic secure chat 7am-7pm After these hours, please refer to coverage provider listed on [email or web address]

## 2020-05-02 DIAGNOSIS — L03116 Cellulitis of left lower limb: Secondary | ICD-10-CM | POA: Diagnosis not present

## 2020-05-02 DIAGNOSIS — A419 Sepsis, unspecified organism: Secondary | ICD-10-CM | POA: Diagnosis not present

## 2020-05-02 MED ORDER — CEPHALEXIN 500 MG PO CAPS: 500.0000 mg | ORAL_CAPSULE | Freq: Four times a day (QID) | ORAL | 0 refills | Status: AC

## 2020-05-02 NOTE — Discharge Summary (Signed)
Physician Discharge Summary  Mayer CamelRickeshia Crudup ZHY:[id number] DOB: 06/10/1985 DOA: 04/28/2020  PCP: Patient, No Pcp Per  Admit date: 04/28/2020 Discharge date: 05/02/2020  Admitted From: Home Disposition: Home  Recommendations for Outpatient Follow-up:  1. Follow ups as below. 2. Please obtain CBC/BMP/Mag at follow up 3. Please follow up on the following pending results: None  Home Health: None required Equipment/Devices: None required  Discharge Condition: Stable CODE STATUS: Full code   Follow-up Information    Inc, [facility]. Schedule an appointment as soon as possible for a visit.   Specialty: Pediatrics Contact information: [address] [place] [id number] [phone]               Hospital Course: 35 year old F with history of morbid obesity and recent COVID-19 infection presented to ED with worsening left leg swelling.  She was evaluated in ED on 03/31/2020 when she tested positive for COVID-19.  At that time, LE US was negative for DVT.  She was given Rocephin and discharged on Bactrim for cellulitis.  She continued to have left foot pain and seen in urgent care on 1/26 and was prescribed prednisone for possible gout flareup that she completed 10 days prior to this admission.  After prednisone, her symptoms resolved but recurred a day prior to admission.  She was admitted for sepsis due to LLE cellulitis, and started on cefepime with improvement in his symptoms.  DVT ultrasound and blood cultures were negative.  Eventually transitioned to Ancef and continues to improve.  She was discharged on p.o. Keflex for 6 more days to complete a total of 10 days course.  She was also encouraged to establish care with primary care doctor  See individual problem list below for more hospital course.  Discharge Diagnoses:  Sepsis secondary to left lower extremity cellulitis: POA.  Sepsis physiology resolved.  Cellulitis improved. -Cefepime>>  Ancef>> Keflex for 6 more days to complete 10 days course. -TOC consulted for PCP need  Morbid obesity -Encourage lifestyle change to lose weight. Body mass index is 42.43 kg/m.            Discharge Exam: Vitals:   05/01/20 2118 05/02/20 0610  BP: 128/74 120/87  Pulse: 78 76  Resp: 18 19  Temp: 97.7 F (36.5 C) 98.4 F (36.9 C)  SpO2: 100% 99%    GENERAL: No apparent distress.  Nontoxic. HEENT: MMM.  Vision and hearing grossly intact.  NECK: Supple.  No apparent JVD.  RESP: On RA.  No IWOB.  Fair aeration bilaterally. CVS:  RRR. Heart sounds normal.  ABD/GI/GU: Bowel sounds present. Soft. Non tender.  MSK/EXT:  Moves extremities. No apparent deformity. No edema.  SKIN: Slight residual erythema over left shin.  No increased warmth to touch.  No fluctuance or tenderness. NEURO: Awake, alert and oriented appropriately.  No apparent focal neuro deficit. PSYCH: Calm. Normal affect.   Discharge Instructions  Discharge Instructions    Call MD for:  redness, tenderness, or signs of infection (pain, swelling, redness, odor or green/yellow discharge around incision site)   Complete by: As directed    Call MD for:  severe uncontrolled pain   Complete by: As directed    Call MD for:  temperature >100.4   Complete by: As directed    Diet general   Complete by: As directed    Discharge instructions   Complete by: As directed    It has been a pleasure taking care of you!  You  were hospitalized due to leg infection/cellulitis.  Your symptoms improved with antibiotics.  We are discharging you more antibiotics to complete treatment course.  It is very important that you establish care with a primary care doctor for follow-up.   In regards to leg swelling with standing, we recommend trying compression socks and leg elevation   Take care,   Increase activity slowly   Complete by: As directed      Allergies as of 05/02/2020   No Known Allergies     Medication List     STOP taking these medications   ibuprofen 200 MG tablet Commonly known as: ADVIL   ibuprofen 400 MG tablet Commonly known as: ADVIL   naproxen 500 MG tablet Commonly known as: NAPROSYN   ondansetron 4 MG disintegrating tablet Commonly known as: Zofran ODT   predniSONE 20 MG tablet Commonly known as: DELTASONE     TAKE these medications   acetaminophen 500 MG tablet Commonly known as: TYLENOL Take 1 tablet (500 mg total) by mouth every 6 (six) hours as needed for fever (chills, body aches).   cephALEXin 500 MG capsule Commonly known as: KEFLEX Take 1 capsule (500 mg total) by mouth 4 (four) times daily for 6 days.       Consultations:  None  Procedures/Studies:   DG Tibia/Fibula Left  Result Date: 04/28/2020 CLINICAL DATA:  Pain and redness EXAM: LEFT TIBIA AND FIBULA - 2 VIEW COMPARISON:  None. FINDINGS: Frontal and lateral views were obtained. No fracture or dislocation. No abnormal periosteal reaction. No soft tissue air or radiopaque foreign body. Joint spaces appear unremarkable. IMPRESSION: No fracture or dislocation. No bony destruction. No soft tissue air. Electronically Signed   By: Bretta Bang III M.D.   On: 04/28/2020 09:13   DG Chest Port 1 View  Result Date: 04/28/2020 CLINICAL DATA:  Cellulitis EXAM: PORTABLE CHEST 1 VIEW COMPARISON:  None. FINDINGS: Lungs are clear. Heart size and pulmonary vascularity are normal. No adenopathy. No bone lesions. IMPRESSION: Lungs clear.  Cardiac silhouette normal. Electronically Signed   By: Bretta Bang III M.D.   On: 04/28/2020 09:11   DG Foot Complete Left  Result Date: 04/28/2020 CLINICAL DATA:  Pain and redness EXAM: LEFT FOOT - COMPLETE 3+ VIEW COMPARISON:  None. FINDINGS: Frontal, oblique, and lateral views were obtained. There is soft tissue swelling dorsally. No radiopaque foreign body or soft tissue air. There is no fracture or dislocation. No appreciable joint space narrowing or erosion. No bony  destruction. There is an inferior calcaneal spur. IMPRESSION: Soft tissue swelling without soft tissue air or radiopaque foreign body. No erosive change or bony destruction. Joint spaces appear normal. No fracture or dislocation. There is an inferior calcaneal spur. Electronically Signed   By: Bretta Bang III M.D.   On: 04/28/2020 09:12   VAS [place] LOWER EXTREMITY VENOUS (DVT) (ONLY [facility] & [facility])  Result Date: 04/28/2020  Lower Venous DVT Study Indications: Edema, and Pain.  Limitations: Patient pain tolerance, unable to tolerate compressions. Comparison Study: no prior Performing Technologist: Blanch Media RVS  Examination Guidelines: A complete evaluation includes B-mode imaging, spectral Doppler, color Doppler, and power Doppler as needed of all accessible portions of each vessel. Bilateral testing is considered an integral part of a complete examination. Limited examinations for reoccurring indications may be performed as noted. The reflux portion of the exam is performed with the patient in reverse Trendelenburg.  +-----+---------------+---------+-----------+----------+--------------+  RIGHT Compressibility Phasicity Spontaneity Properties Thrombus Aging  +-----+---------------+---------+-----------+----------+--------------+  CFV  Yes       Yes                                    +-----+---------------+---------+-----------+----------+--------------+   +---------+---------------+---------+-----------+----------+-------------------+  LEFT      Compressibility Phasicity Spontaneity Properties Thrombus Aging       +---------+---------------+---------+-----------+----------+-------------------+  CFV       Full            Yes       Yes                                         +---------+---------------+---------+-----------+----------+-------------------+  SFJ       Full                                                                   +---------+---------------+---------+-----------+----------+-------------------+  FV Prox                   Yes       Yes                                         +---------+---------------+---------+-----------+----------+-------------------+  FV Mid    Full                                                                  +---------+---------------+---------+-----------+----------+-------------------+  FV Distal Full                                                                  +---------+---------------+---------+-----------+----------+-------------------+  PFV                       Yes       Yes                                         +---------+---------------+---------+-----------+----------+-------------------+  POP       Full            Yes       Yes                                         +---------+---------------+---------+-----------+----------+-------------------+  PTV       Full                                                                  +---------+---------------+---------+-----------+----------+-------------------+  PERO                                                       Not well visualized  +---------+---------------+---------+-----------+----------+-------------------+     Summary: RIGHT: - No evidence of common femoral vein obstruction.  LEFT: - There is no evidence of deep vein thrombosis in the lower extremity. However, portions of this examination were limited- see technologist comments above.  - No cystic structure found in the popliteal fossa.  *See table(s) above for measurements and observations. Electronically signed by Coral Else MD on 04/28/2020 at 7:58:32 PM.    Final         The results of significant diagnostics from this hospitalization (including imaging, microbiology, ancillary and laboratory) are listed below for reference.     Microbiology: Recent Results (from the past 240 hour(s))  Blood Culture (routine x 2)     Status: None (Preliminary result)    Collection Time: 04/28/20  7:26 AM   Specimen: BLOOD  Result Value Ref Range Status   Specimen Description   Final    BLOOD BLOOD LEFT HAND Performed at [facility], [address] [place], [place] [address]    Special Requests   Final    BOTTLES DRAWN AEROBIC AND ANAEROBIC Blood Culture adequate volume Performed at [facility], [address], [place], [place] [address]    Culture   Final    NO GROWTH 4 DAYS Performed at [facility], [address] [place], [place] [address]    Report Status PENDING  Incomplete  Urine culture     Status: Abnormal   Collection Time: 04/28/20  7:26 AM   Specimen: In/Out Cath Urine  Result Value Ref Range Status   Specimen Description   Final    IN/OUT CATH URINE Performed at [facility], [address], [place], [place] [address]    Special Requests   Final    NONE Performed at [facility], [address], [place], [place] [address]    Culture MULTIPLE SPECIES PRESENT, SUGGEST RECOLLECTION (A)  Final   Report Status 04/29/2020 FINAL  Final  Resp Panel by RT-PCR (Flu A&B, Covid) Nasopharyngeal Swab     Status: None   Collection Time: 04/28/20  7:33 AM   Specimen: Nasopharyngeal Swab; Nasopharyngeal(NP) swabs in vial transport medium  Result Value Ref Range Status   SARS Coronavirus 2 by RT PCR NEGATIVE NEGATIVE Final    Comment: (NOTE) SARS-CoV-2 target nucleic acids are NOT DETECTED.  The SARS-CoV-2 RNA is generally detectable in upper respiratory specimens during the acute phase of infection. The lowest concentration of SARS-CoV-2 viral copies this assay can detect is 138 copies/mL. A negative result does not preclude SARS-Cov-2 infection and should not be used as the sole basis for treatment or other patient management decisions. A negative result may occur with  improper specimen collection/handling, submission of specimen other than nasopharyngeal  swab, presence of viral mutation(s) within the areas targeted by this assay, and inadequate number of viral copies(<138 copies/mL). A negative result must be combined with clinical observations, patient history, and epidemiological information. The expected result is Negative.  Fact Sheet for Patients:  BloggerCourse.com  Fact Sheet for Healthcare Providers:  SeriousBroker.it  This test is no t yet approved or cleared by  the [employer] and  has been authorized for detection and/or diagnosis of SARS-CoV-2 by FDA under an Emergency Use Authorization (EUA). This EUA will remain  in effect (meaning this test can be used) for the duration of the COVID-19 declaration under Section 564(b)(1) of the Act, 21 U.S.C.section 360bbb-3(b)(1), unless the authorization is terminated  or revoked sooner.       Influenza A by PCR NEGATIVE NEGATIVE Final   Influenza B by PCR NEGATIVE NEGATIVE Final    Comment: (NOTE) The Xpert Xpress SARS-CoV-2/FLU/RSV plus assay is intended as an aid in the diagnosis of influenza from Nasopharyngeal swab specimens and should not be used as a sole basis for treatment. Nasal washings and aspirates are unacceptable for Xpert Xpress SARS-CoV-2/FLU/RSV testing.  Fact Sheet for Patients: BloggerCourse.com  Fact Sheet for Healthcare Providers: SeriousBroker.it  This test is not yet approved or cleared by the [place] FDA and has been authorized for detection and/or diagnosis of SARS-CoV-2 by FDA under an Emergency Use Authorization (EUA). This EUA will remain in effect (meaning this test can be used) for the duration of the COVID-19 declaration under Section 564(b)(1) of the Act, 21 U.S.C. section 360bbb-3(b)(1), unless the authorization is terminated or revoked.  Performed at [facility] [address] [place], [place] [address]    Blood Culture (routine x 2)     Status: None (Preliminary result)   Collection Time: 04/28/20  7:36 AM   Specimen: BLOOD  Result Value Ref Range Status   Specimen Description   Final    BLOOD LEFT ANTECUBITAL Performed at [facility], [address], [place], [place] [address]    Special Requests   Final    BOTTLES DRAWN AEROBIC AND ANAEROBIC Blood Culture results may not be optimal due to an excessive volume of blood received in culture bottles Performed at [facility], [address] [place], [place] [address]    Culture   Final    NO GROWTH 4 DAYS Performed at [facility], [address], [place], [place] [address]    Report Status PENDING  Incomplete     Labs:  CBC: Recent Labs  Lab 04/28/20 0726 04/29/20 0643 04/30/20 0518  WBC 19.6* 9.9 7.4  NEUTROABS 17.0*  --   --   HGB 14.0 12.9 13.4  HCT 43.8 41.4 43.3  MCV 85.9 87.2 87.5  PLT 176 148* 160   BMP &GFR Recent Labs  Lab 04/28/20 0726 04/29/20 0643 04/30/20 0518  NA 136 138 139  K 3.7 3.6 4.0  CL 103 107 106  CO2 GLUCOSE 113* 104* 89  BUN CREATININE 0.90 0.66 0.68  CALCIUM 8.7* 8.3* 8.3*   Estimated Creatinine Clearance: 124.7 mL/min (by C-G formula based on SCr of 0.68 mg/dL). Liver & Pancreas: Recent Labs  Lab 04/28/20 0726  AST 20  ALT 24  ALKPHOS 53  BILITOT 0.6  PROT 7.6  ALBUMIN 3.8   No results for input(s): LIPASE, AMYLASE in the last 168 hours. No results for input(s): AMMONIA in the last 168 hours. Diabetic: No results for input(s): HGBA1C in the last 72 hours. No results for input(s): GLUCAP in the last 168 hours. Cardiac Enzymes: No results for input(s): CKTOTAL, CKMB, CKMBINDEX, TROPONINI in the last 168 hours. No results for input(s): PROBNP in the last 8760 hours. Coagulation Profile: Recent Labs  Lab 04/28/20 0726 04/29/20 0643  INR 1.1 1.1  Thyroid Function Tests: No results for input(s): TSH,  T4TOTAL, FREET4, T3FREE, THYROIDAB in the last 72 hours. Lipid Profile: No results for input(s): CHOL, HDL, LDLCALC, TRIG, CHOLHDL, LDLDIRECT in the last 72 hours. Anemia Panel: No results for input(s): VITAMINB12, FOLATE, FERRITIN, TIBC, IRON, RETICCTPCT in the last 72 hours. Urine analysis:    Component Value Date/Time   COLORURINE YELLOW 04/28/2020 0726   APPEARANCEUR CLEAR 04/28/2020 0726   LABSPEC 1.017 04/28/2020 0726   PHURINE 9.0 (H) 04/28/2020 0726   GLUCOSEU NEGATIVE 04/28/2020 0726   HGBUR NEGATIVE 04/28/2020 0726   BILIRUBINUR NEGATIVE 04/28/2020 0726   KETONESUR 5 (A) 04/28/2020 0726   PROTEINUR NEGATIVE 04/28/2020 0726   NITRITE NEGATIVE 04/28/2020 0726   LEUKOCYTESUR SMALL (A) 04/28/2020 0726   Sepsis Labs: Invalid input(s): PROCALCITONIN, LACTICIDVEN   Time coordinating discharge: 25 minutes  SIGNED:  Almon Hercules, MD  [facility] 05/02/2020, 10:33 AM  If 7PM-7AM, please contact night-coverage [email or web address]

## 2020-05-02 NOTE — TOC Transition Note (Addendum)
Transition of Care [facility]) - CM/SW Discharge Note   Patient Details  Name: Audrey Armstrong MRN: [id number] Date of Birth: [birth year]  [facility] CM/SW Contact:  Clearance Coots, LCSW Phone Number: 05/02/2020, 10:30 AM   Clinical Narrative:    CSW reached out to the patient to assist with a PCP follow up. Patient reports she does not have a PCP. Per chart review, the local clinic [facility] in [place] is in network with [facility] medicaid prepaid health plan [employer]. Patient reports she recently moved to [place]. CSW offered PCP information for the [facility] location. Patient prefers the [place] location and  request to make her own follow up appointment. Information written on AVS.   Final next level of care: Home/Self Care Barriers to Discharge: No Barriers Identified   Patient Goals and CMS Choice        Discharge Placement                       Discharge Plan and Services                                     Social Determinants of Health (SDOH) Interventions     Readmission Risk Interventions No flowsheet data found.

## 2020-05-02 NOTE — Progress Notes (Signed)
Pt alert and oriented, tolerating diet. D/C instructions given, pt d/cd home. 

## 2020-05-03 LAB — CULTURE, BLOOD (ROUTINE X 2)
Culture: NO GROWTH
Culture: NO GROWTH
Special Requests: ADEQUATE

## 2021-03-19 ENCOUNTER — Ambulatory Visit
Admission: EM | Admit: 2021-03-19 | Discharge: 2021-03-19 | Disposition: A | Discharge status: Home / Self Care | Payer: Medicaid Other | Attending: Physician Assistant | Admitting: Physician Assistant

## 2021-03-19 ENCOUNTER — Other Ambulatory Visit: Payer: Self-pay

## 2021-03-19 DIAGNOSIS — J029 Acute pharyngitis, unspecified: Secondary | ICD-10-CM

## 2021-03-19 LAB — POCT RAPID STREP A (OFFICE): Rapid Strep A Screen: NEGATIVE

## 2021-03-19 NOTE — ED Triage Notes (Signed)
Pt presents with sore throat x 3 days.  Denies sinus pressure, nasal drainage, HA, cough, body aches, etc.  Taking penicillin that she had from earlier this year as well as mucinex and drinking hot tea.

## 2021-03-19 NOTE — ED Provider Notes (Signed)
[facility]    CSN: [id number] Arrival date & time: 03/19/21  1027      History   Chief Complaint Chief Complaint  Patient presents with   Sore Throat    HPI Audrey Armstrong is a 36 y.o. female.   Patient here today for evaluation of sore throat she has had for the last 3 days. She reports that she has no other symptoms. She has not had fever. She has not had nasal congestion or drainage. She denies headache or body aches. She has not had cough. She has taken left over penicillin and mucinex with minimal relief.  The history is provided by the patient.  Sore Throat Pertinent negatives include no abdominal pain and no shortness of breath.   History reviewed. No pertinent past medical history.  Patient Active Problem List   Diagnosis Date Noted   Sepsis (HCC) 04/28/2020   Cellulitis 04/28/2020   Obesity, Class III, BMI 40-49.9 (morbid obesity) (HCC) 04/28/2020    Past Surgical History:  Procedure Laterality Date   TUBAL LIGATION      OB History   No obstetric history on file.      Home Medications    Prior to Admission medications   Medication Sig Start Date End Date Taking? Authorizing Provider  acetaminophen (TYLENOL) 500 MG tablet Take 1 tablet (500 mg total) by mouth every 6 (six) hours as needed for fever (chills, body aches). 03/02/19   Lorelee New, PA-C    Family History History reviewed. No pertinent family history.  Social History Social History   Tobacco Use   Smoking status: Never   Smokeless tobacco: Never  Vaping Use   Vaping Use: Never used  Substance Use Topics   Alcohol use: No   Drug use: No     Allergies   Patient has no known allergies.   Review of Systems Review of Systems  Constitutional:  Negative for chills and fever.  HENT:  Positive for sore throat. Negative for congestion, ear pain and sinus pressure.   Eyes:  Negative for discharge and redness.  Respiratory:  Negative for cough, shortness of  breath and wheezing.   Gastrointestinal:  Negative for abdominal pain, diarrhea, nausea and vomiting.    Physical Exam Triage Vital Signs ED Triage Vitals [03/19/21 1103]  Enc Vitals Group     BP      Pulse      Resp      Temp      Temp src      SpO2      Weight      Height      Head Circumference      Peak Flow      Pain Score 8     Pain Loc      Pain Edu?      Excl. in GC?    No data found.  Updated Vital Signs BP 128/84 (BP Location: Left Arm)    Pulse 79    Temp 98 F (36.7 C) (Oral)    Resp 18    LMP 02/21/2021 (Approximate)    SpO2 97%      Physical Exam Vitals and nursing note reviewed.  Constitutional:      General: She is not in acute distress.    Appearance: Normal appearance. She is not ill-appearing.  HENT:     Head: Normocephalic and atraumatic.     Nose: No congestion or rhinorrhea.     Mouth/Throat:  Mouth: Mucous membranes are moist.     Pharynx: Posterior oropharyngeal erythema present. No oropharyngeal exudate.  Eyes:     Conjunctiva/sclera: Conjunctivae normal.  Cardiovascular:     Rate and Rhythm: Normal rate and regular rhythm.     Heart sounds: Normal heart sounds. No murmur heard. Pulmonary:     Effort: Pulmonary effort is normal. No respiratory distress.     Breath sounds: Normal breath sounds. No wheezing, rhonchi or rales.  Skin:    General: Skin is warm and dry.  Neurological:     Mental Status: She is alert.  Psychiatric:        Mood and Affect: Mood normal.        Thought Content: Thought content normal.     UC Treatments / Results  Labs (all labs ordered are listed, but only abnormal results are displayed) Labs Reviewed  POCT RAPID STREP A (OFFICE)    EKG   Radiology No results found.  Procedures Procedures (including critical care time)  Medications Ordered in UC Medications - No data to display  Initial Impression / Assessment and Plan / UC Course  I have reviewed the triage vital signs and the nursing  notes.  Pertinent labs & imaging results that were available during my care of the patient were reviewed by me and considered in my medical decision making (see chart for details).    Strep screening negative in office. Will order throat culture. Recommend symptomatic treatment while awaiting results. Encourage follow up with any further concerns.  Final Clinical Impressions(s) / UC Diagnoses   Final diagnoses:  Acute pharyngitis, unspecified etiology   Discharge Instructions   None    ED Prescriptions   None    PDMP not reviewed this encounter.   Tomi Bamberger, PA-C 03/19/21 1208

## 2022-10-10 ENCOUNTER — Other Ambulatory Visit: Payer: Self-pay

## 2022-10-10 ENCOUNTER — Emergency Department (HOSPITAL_COMMUNITY): Payer: Commercial Managed Care - HMO

## 2022-10-10 ENCOUNTER — Emergency Department (HOSPITAL_COMMUNITY)
Admission: EM | Admit: 2022-10-10 | Discharge: 2022-10-10 | Disposition: A | Discharge status: Home / Self Care | Payer: Commercial Managed Care - HMO | Attending: Emergency Medicine | Admitting: Emergency Medicine

## 2022-10-10 ENCOUNTER — Encounter (HOSPITAL_COMMUNITY): Payer: Self-pay

## 2022-10-10 DIAGNOSIS — R079 Chest pain, unspecified: Secondary | ICD-10-CM | POA: Diagnosis present

## 2022-10-10 DIAGNOSIS — R1013 Epigastric pain: Secondary | ICD-10-CM | POA: Diagnosis not present

## 2022-10-10 DIAGNOSIS — K2100 Gastro-esophageal reflux disease with esophagitis, without bleeding: Secondary | ICD-10-CM

## 2022-10-10 LAB — CBC WITH DIFFERENTIAL/PLATELET
Abs Immature Granulocytes: 0.04 10*3/uL (ref 0.00–0.07)
Basophils Absolute: 0 10*3/uL (ref 0.0–0.1)
Basophils Relative: 0 %
Eosinophils Absolute: 0.2 10*3/uL (ref 0.0–0.5)
Eosinophils Relative: 2 %
HCT: 42.3 % (ref 36.0–46.0)
Hemoglobin: 13.2 g/dL (ref 12.0–15.0)
Immature Granulocytes: 0 %
Lymphocytes Relative: 27 %
Lymphs Abs: 2.5 10*3/uL (ref 0.7–4.0)
MCH: 26.4 pg (ref 26.0–34.0)
MCHC: 31.2 g/dL (ref 30.0–36.0)
MCV: 84.6 fL (ref 80.0–100.0)
Monocytes Absolute: 0.8 10*3/uL (ref 0.1–1.0)
Monocytes Relative: 9 %
Neutro Abs: 5.8 10*3/uL (ref 1.7–7.7)
Neutrophils Relative %: 62 %
Platelets: 194 10*3/uL (ref 150–400)
RBC: 5 MIL/uL (ref 3.87–5.11)
RDW: 14.6 % (ref 11.5–15.5)
WBC: 9.4 10*3/uL (ref 4.0–10.5)
nRBC: 0 % (ref 0.0–0.2)

## 2022-10-10 LAB — TROPONIN I (HIGH SENSITIVITY)
Troponin I (High Sensitivity): 3 ng/L (ref <18)
Troponin I (High Sensitivity): 3 ng/L (ref <18)

## 2022-10-10 LAB — COMPREHENSIVE METABOLIC PANEL
ALT: 19 U/L (ref 0–44)
AST: 19 U/L (ref 15–41)
Albumin: 3.4 g/dL — ABNORMAL LOW (ref 3.5–5.0)
Alkaline Phosphatase: 60 U/L (ref 38–126)
Anion gap: 12 (ref 5–15)
BUN: 9 mg/dL (ref 6–20)
CO2: 21 mmol/L — ABNORMAL LOW (ref 22–32)
Calcium: 8.7 mg/dL — ABNORMAL LOW (ref 8.9–10.3)
Chloride: 105 mmol/L (ref 98–111)
Creatinine, Ser: 0.76 mg/dL (ref 0.44–1.00)
GFR, Estimated: >60 mL/min (ref >60)
Glucose, Bld: 99 mg/dL (ref 70–99)
Potassium: 3.6 mmol/L (ref 3.5–5.1)
Sodium: 138 mmol/L (ref 135–145)
Total Bilirubin: 0.2 mg/dL — ABNORMAL LOW (ref 0.3–1.2)
Total Protein: 6.9 g/dL (ref 6.5–8.1)

## 2022-10-10 LAB — LIPASE, BLOOD: Lipase: 32 U/L (ref 11–51)

## 2022-10-10 MED ORDER — SODIUM CHLORIDE 0.9 % IV BOLUS
1000.0000 mL | Freq: Once | INTRAVENOUS | Status: AC
  Administered 2022-10-10: 1000 mL via INTRAVENOUS

## 2022-10-10 MED ORDER — ALUM & MAG HYDROXIDE-SIMETH 200-200-20 MG/5ML PO SUSP
30.0000 mL | Freq: Once | ORAL | Status: AC
  Administered 2022-10-10: 30 mL via ORAL
  Filled 2022-10-10: qty 30

## 2022-10-10 MED ORDER — OMEPRAZOLE 20 MG PO CPDR: 20.0000 mg | DELAYED_RELEASE_CAPSULE | Freq: Every day | ORAL | 1 refills | Status: AC

## 2022-10-10 MED ORDER — FENTANYL CITRATE PF 50 MCG/ML IJ SOSY
50.0000 ug | PREFILLED_SYRINGE | Freq: Once | INTRAMUSCULAR | Status: AC
  Administered 2022-10-10: 50 ug via INTRAVENOUS
  Filled 2022-10-10: qty 1

## 2022-10-10 MED ORDER — IOHEXOL 350 MG/ML SOLN
75.0000 mL | Freq: Once | INTRAVENOUS | Status: AC | PRN
  Administered 2022-10-10: 75 mL via INTRAVENOUS

## 2022-10-10 MED ORDER — PANTOPRAZOLE SODIUM 40 MG IV SOLR
40.0000 mg | Freq: Once | INTRAVENOUS | Status: AC
  Administered 2022-10-10: 40 mg via INTRAVENOUS
  Filled 2022-10-10: qty 10

## 2022-10-10 MED ORDER — SUCRALFATE 1 G PO TABS: 1.0000 g | ORAL_TABLET | Freq: Three times a day (TID) | ORAL | 1 refills | Status: AC

## 2022-10-10 NOTE — Discharge Instructions (Addendum)
Blood work and x-rays today look normal.  Concerned that you may be having acid because of all the migraine medication.  Try to avoid any ibuprofen, Goody's or BC powders for the next few weeks.  Tylenol is okay.  Also avoid spicy foods and foods with a lot of acid like tomatoes, pineapple etc.  Avoid eating at least 2 hours before laying down.  You were given 2 medications.  1 is an antacid that should help decrease the amount of acid but it will take at least a week to start working so can continue to take it.  The other medication is to help with symptoms right now and you can take that 30 minutes before you eat and before you go to bed to help with symptom control.

## 2022-10-10 NOTE — ED Triage Notes (Signed)
Pt bib ems for sudden onset chest pain this morning while getting ready for work. Pain 10/10. Pt given 324 ASA and 2 nitroglycerin without relief of pain. Pt a.o, VSS

## 2022-10-10 NOTE — ED Provider Notes (Signed)
CT was negative for dissection or aneurysm.  No reported PEs.  Patient's labs are otherwise reassuring.  Patient is describing a burning sensation that is worse when she drinks which may be related to GERD.  She reports she has been having a lot more migraines recently and she has been taking ibuprofen and Goody's powders.  Recommended that she stop those medications will start her on a PPI and Carafate.  She was discharged home in good condition.  No indication for further testing at this time.   Gwyneth Sprout, MD 10/10/22 [phone]

## 2022-10-10 NOTE — ED Provider Notes (Signed)
[place] EMERGENCY DEPARTMENT AT [facility] Provider Note   CSN: [id number] Arrival date & time: 10/10/22  1040     History  Chief Complaint  Patient presents with   Chest Pain    Audrey Armstrong is a 37 y.o. female.  Patient is a 37 year old female who presents with chest pain.  She states she has been having some intermittent pain in her mid chest area for the last couple of days but during the night it got more intense.  When she woke up this morning it got more intense.  She has no shortness of breath.  No nausea or vomiting.  No history of prior cardiac disease.  No prior history of gallbladder disease.  No history of GERD.       Home Medications Prior to Admission medications   Medication Sig Start Date End Date Taking? Authorizing Provider  acetaminophen (TYLENOL) 500 MG tablet Take 1 tablet (500 mg total) by mouth every 6 (six) hours as needed for fever (chills, body aches). 03/02/19   Lorelee New, PA-C      Allergies    Patient has no known allergies.    Review of Systems   Review of Systems  Constitutional:  Negative for chills, diaphoresis, fatigue and fever.  HENT:  Negative for congestion, rhinorrhea and sneezing.   Eyes: Negative.   Respiratory:  Positive for shortness of breath (Minimal). Negative for cough and chest tightness.   Cardiovascular:  Positive for chest pain. Negative for leg swelling.  Gastrointestinal:  Negative for abdominal pain, blood in stool, diarrhea, nausea and vomiting.  Genitourinary:  Negative for difficulty urinating, flank pain, frequency and hematuria.  Musculoskeletal:  Negative for arthralgias and back pain.  Skin:  Negative for rash.  Neurological:  Negative for dizziness, speech difficulty, weakness, numbness and headaches.    Physical Exam Updated Vital Signs BP 119/83   Pulse 68   Temp 97.6 F (36.4 C) (Oral)   Resp 17   SpO2 100%  Physical Exam Constitutional:      Appearance: She is  well-developed.  HENT:     Head: Normocephalic and atraumatic.  Eyes:     Pupils: Pupils are equal, round, and reactive to light.  Cardiovascular:     Rate and Rhythm: Normal rate and regular rhythm.     Heart sounds: Normal heart sounds.  Pulmonary:     Effort: Pulmonary effort is normal. No respiratory distress.     Breath sounds: Normal breath sounds. No wheezing or rales.  Chest:     Chest wall: No tenderness.  Abdominal:     General: Bowel sounds are normal.     Palpations: Abdomen is soft.     Tenderness: There is abdominal tenderness (Tenderness to the epigastrium). There is no guarding or rebound.  Musculoskeletal:        General: Normal range of motion.     Cervical back: Normal range of motion and neck supple.  Lymphadenopathy:     Cervical: No cervical adenopathy.  Skin:    General: Skin is warm and dry.     Findings: No rash.  Neurological:     Mental Status: She is alert and oriented to person, place, and time.     ED Results / Procedures / Treatments   Labs (all labs ordered are listed, but only abnormal results are displayed) Labs Reviewed  COMPREHENSIVE METABOLIC PANEL - Abnormal; Notable for the following components:      Result Value   CO2  21 (*)    Calcium 8.7 (*)    Albumin 3.4 (*)    Total Bilirubin 0.2 (*)    All other components within normal limits  CBC WITH DIFFERENTIAL/PLATELET  LIPASE, BLOOD  TROPONIN I (HIGH SENSITIVITY)  TROPONIN I (HIGH SENSITIVITY)    EKG EKG Interpretation Date/Time:  Friday October 10 2022 10:46:27 EDT Ventricular Rate:  87 PR Interval:  158 QRS Duration:  88 QT Interval:  372 QTC Calculation: 447 R Axis:   80  Text Interpretation: Normal sinus rhythm Nonspecific T wave abnormality Abnormal ECG When compared with ECG of 28-Apr-2020 07:39, PREVIOUS ECG IS PRESENT since last tracing no significant change Confirmed by Rolan Bucco [id number]) on 10/10/2022 10:54:52 AM  Radiology US Abdomen Limited RUQ  (LIVER/GB)  Result Date: 10/10/2022 CLINICAL DATA:  151471 RUQ pain 151471 EXAM: ULTRASOUND ABDOMEN LIMITED RIGHT UPPER QUADRANT COMPARISON:  None Available. FINDINGS: Limitations: Poor visualization due to decreased signal to noise ratio in the setting of prominent subcutaneous adipose tissues. Gallbladder: No gallstones or wall thickening visualized. No sonographic Murphy sign noted by sonographer. Common bile duct: Diameter: 2 mm, which is normal. Liver: No focal lesion identified. Within normal limits in parenchymal echogenicity. Portal vein is patent on color Doppler imaging with normal direction of blood flow towards the liver. Other: None. IMPRESSION: No sonographic finding to explain right upper quadrant pain. Electronically Signed   By: Lorenza Cambridge M.D.   On: 10/10/2022 13:25   DG Chest 2 View  Result Date: 10/10/2022 CLINICAL DATA:  chest pain EXAM: CHEST - 2 VIEW COMPARISON:  None Available. FINDINGS: No pleural effusion. No pneumothorax. No focal airspace opacity. Borderline cardiomegaly. No radiographically apparent displaced rib fractures. Visualized upper abdomen is unremarkable. Vertebral body heights are maintained. IMPRESSION: No focal airspace opacity Electronically Signed   By: Lorenza Cambridge M.D.   On: 10/10/2022 12:30    Procedures Procedures    Medications Ordered in ED Medications  pantoprazole (PROTONIX) injection 40 mg (40 mg Intravenous Given 10/10/22 1150)  fentaNYL (SUBLIMAZE) injection 50 mcg (50 mcg Intravenous Given 10/10/22 1149)  sodium chloride 0.9 % bolus 1,000 mL (1,000 mLs Intravenous New Bag/Given 10/10/22 1422)  alum & mag hydroxide-simeth (MAALOX/MYLANTA) 200-200-20 MG/5ML suspension 30 mL (30 mLs Oral Given 10/10/22 1423)  sodium chloride 0.9 % bolus 1,000 mL (1,000 mLs Intravenous New Bag/Given 10/10/22 1423)    ED Course/ Medical Decision Making/ A&P                             Medical Decision Making Amount and/or Complexity of Data Reviewed Labs:  ordered. Radiology: ordered.  Risk OTC drugs. Prescription drug management.   Patient is a 37 year old who presents with chest pain.  It is in the center of her chest and otherwise nonradiating.  She did have some epigastric tenderness on arrival.  She was given some pain medication.  Her EKG does not show any ischemic changes.  She has had 2 negative troponins.  Chest x-ray was interpreted by me and confirmed by the radiologist to show no acute abnormality.  No pneumothorax.  No pneumonia.  No pulmonary edema.  Given her associated abdominal tenderness, abdominal ultrasound was performed which shows no evidence of gallbladder disease.  Protonix was given.  On reexam, she still had significant pain in the center of her chest.  Seems to somewhat go to her back.  Will check a CTA to assess her aorta..  Care turned over  to Dr. Anitra Lauth pending CTA.  Final Clinical Impression(s) / ED Diagnoses Final diagnoses:  Nonspecific chest pain    Rx / DC Orders ED Discharge Orders     None         Rolan Bucco, MD 10/10/22 1630

## 2022-10-10 NOTE — ED Notes (Signed)
IV team at bedside 

## 2023-01-14 IMAGING — CR DG TIBIA/FIBULA 2V*L*
4 series · 4 of 4 positions shown · non-contrast
Comparison: None.

CLINICAL DATA: Pain and redness

EXAM:
LEFT TIBIA AND FIBULA - 2 VIEW

[x tib-fib ap left (1 of 2)]
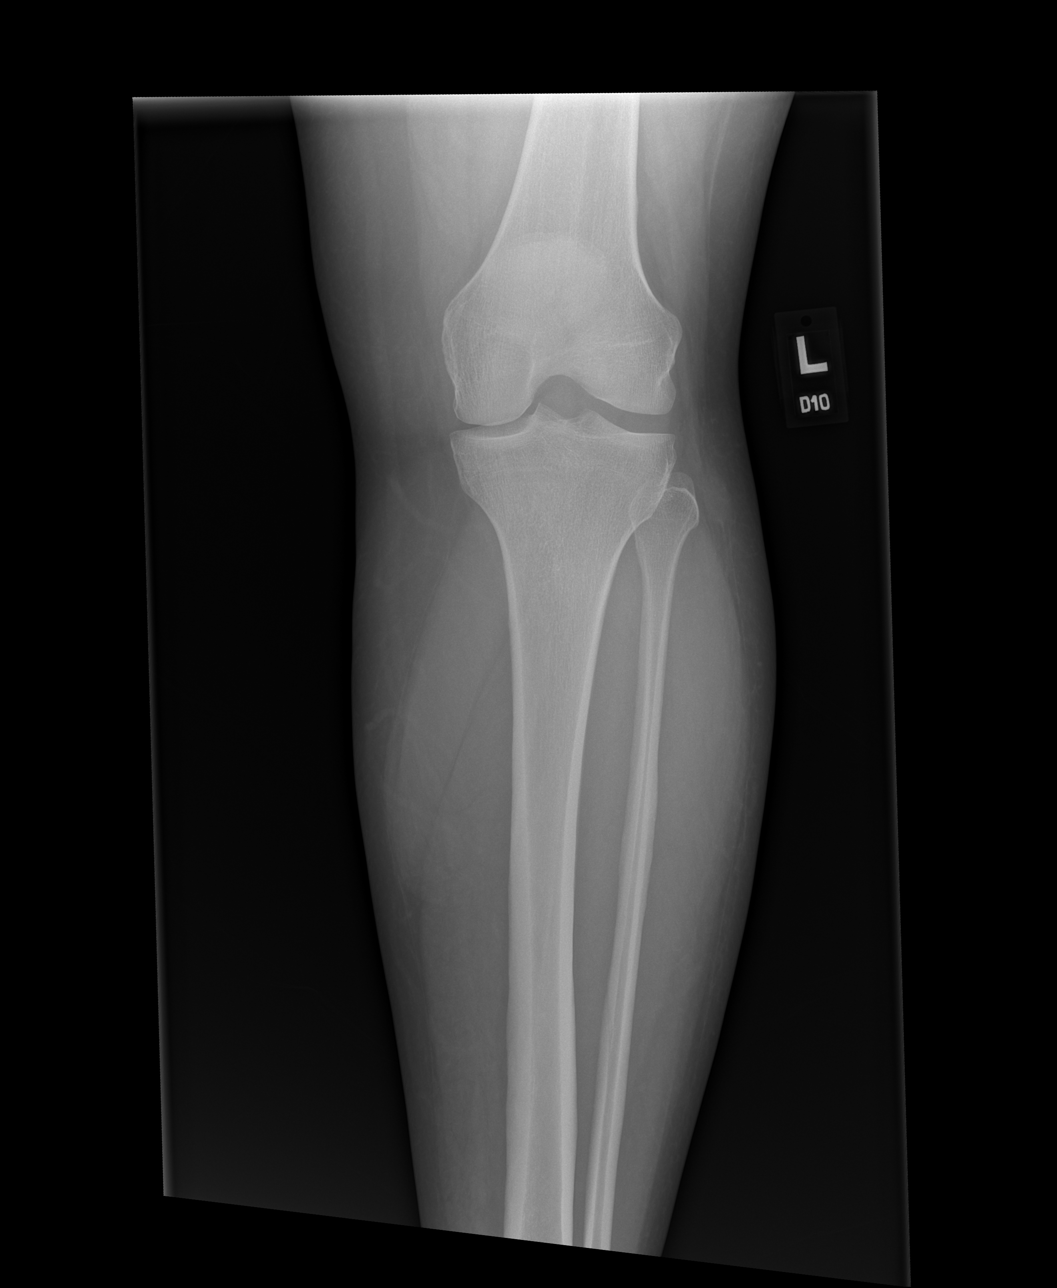

[x tib-fib ap left (2 of 2)]
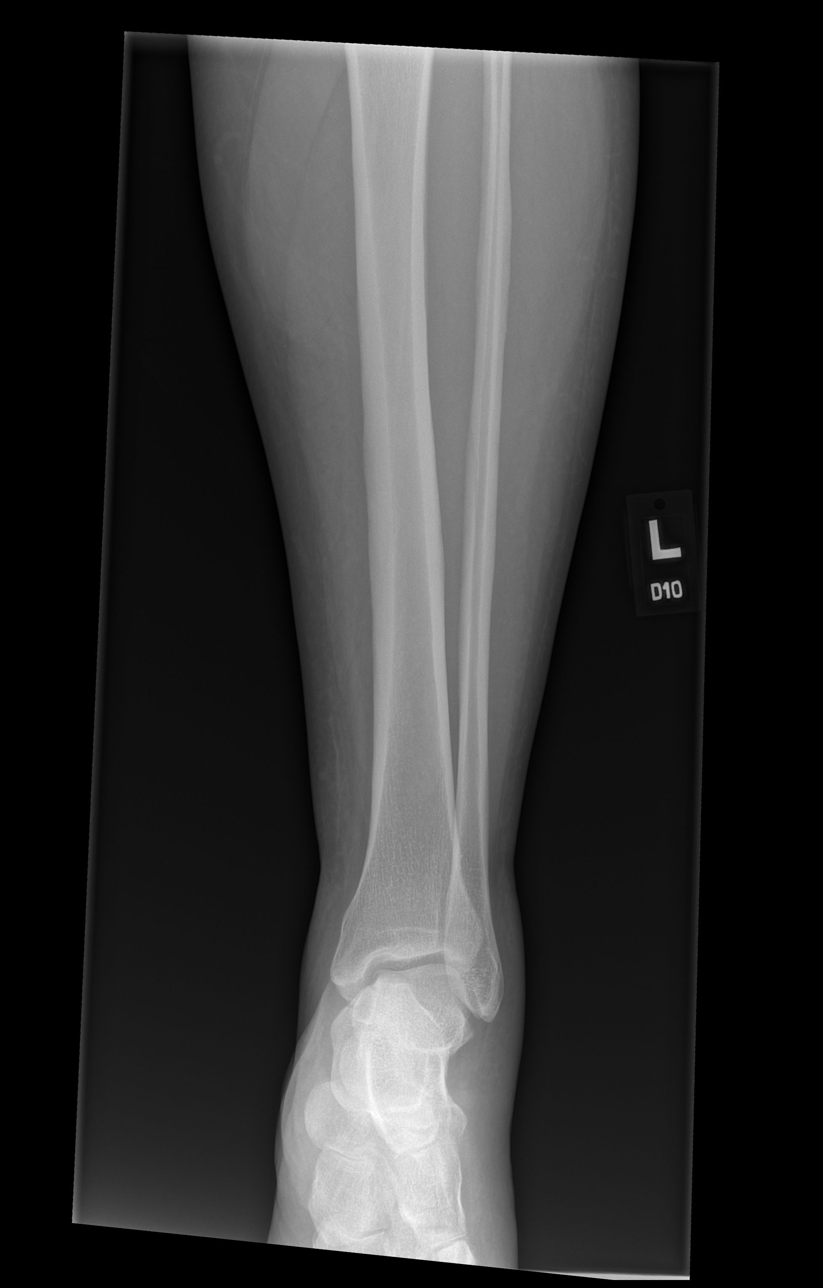

[x tib-fib lat left (1 of 2)]
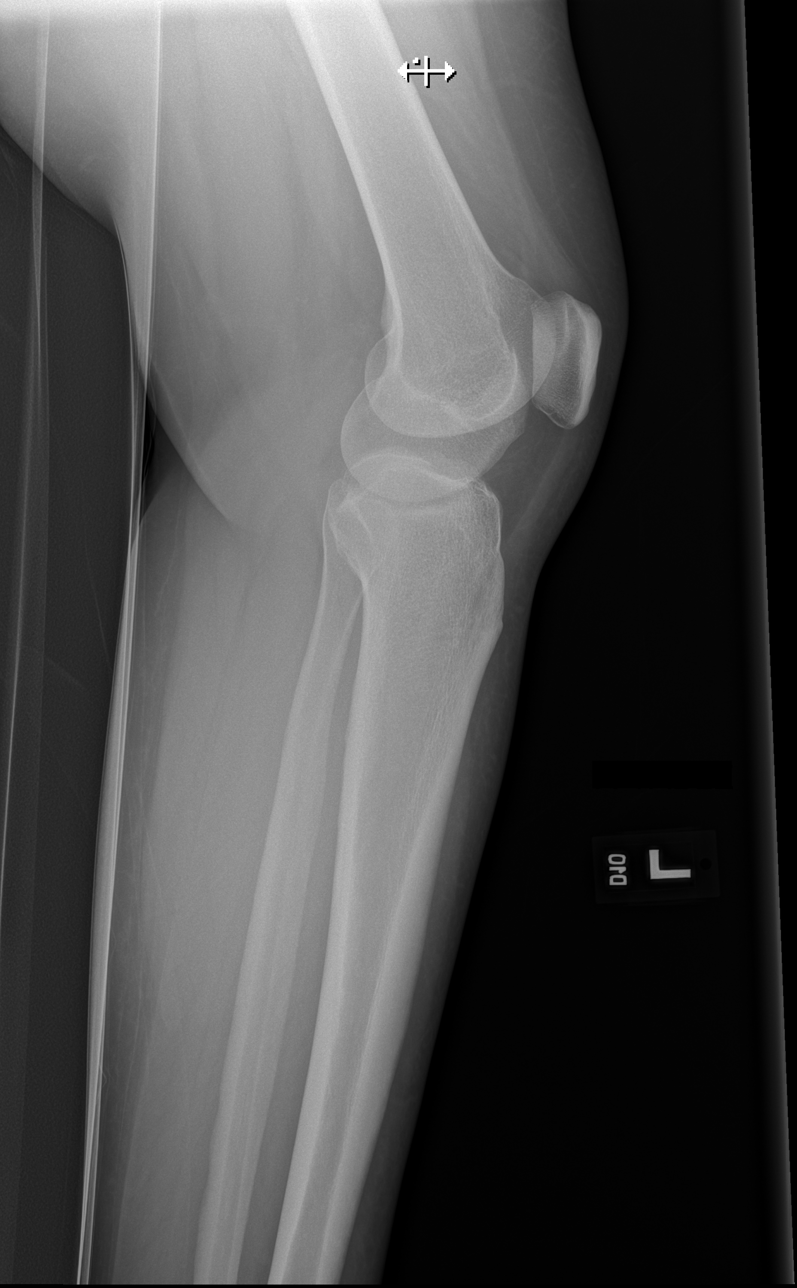

[x tib-fib lat left (2 of 2)]
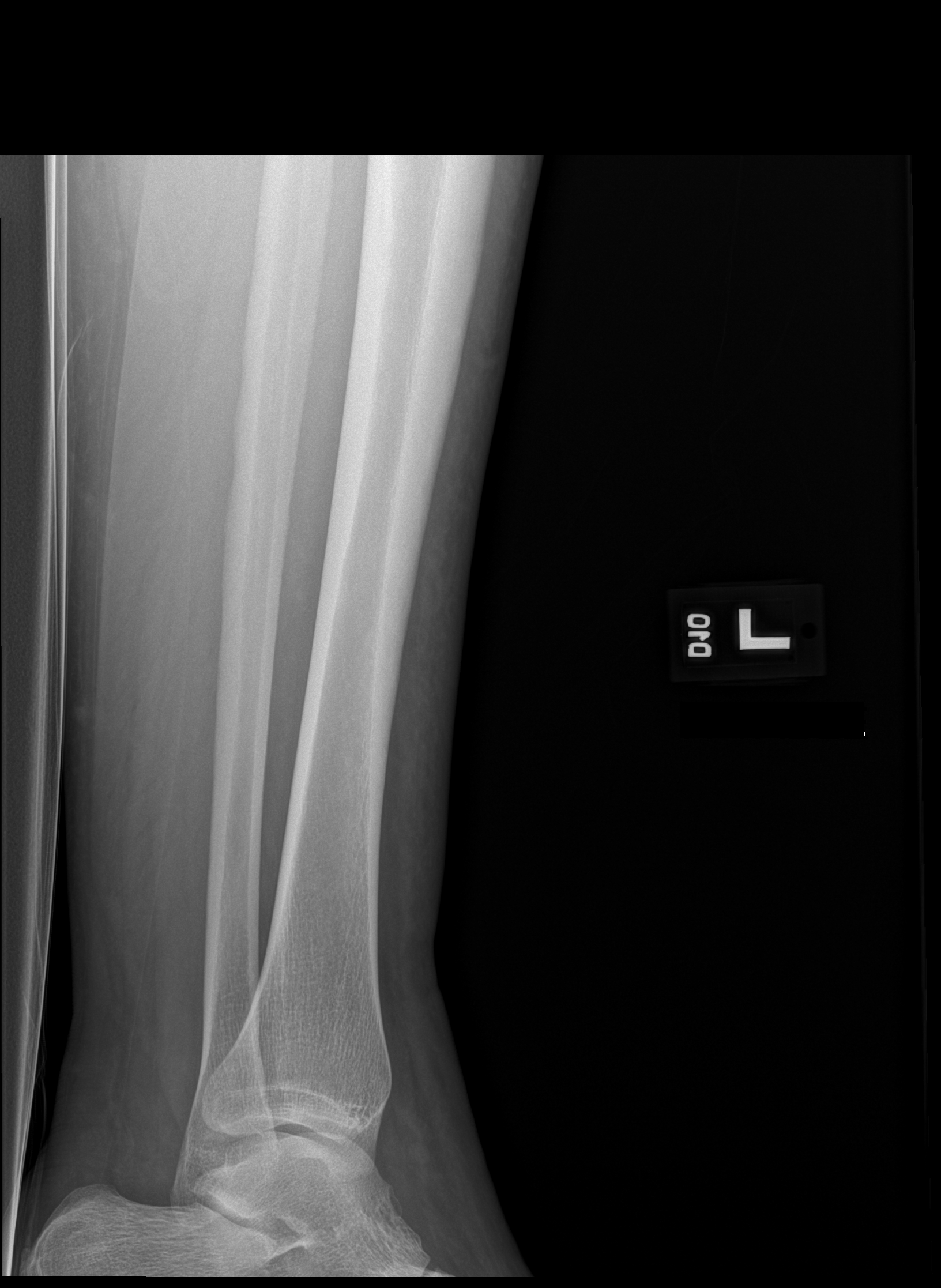

[4 of 4 positions shown; findings below may reference images not displayed]

FINDINGS: Frontal and lateral views were obtained. No fracture or dislocation.
No abnormal periosteal reaction. No soft tissue air or radiopaque
foreign body. Joint spaces appear unremarkable.
IMPRESSION: No fracture or dislocation. No bony destruction. No soft tissue air.

## 2023-08-19 ENCOUNTER — Emergency Department (HOSPITAL_BASED_OUTPATIENT_CLINIC_OR_DEPARTMENT_OTHER)
Admission: EM | Admit: 2023-08-19 | Discharge: 2023-08-19 | Disposition: A | Discharge status: Home / Self Care | Payer: Self-pay | Attending: Emergency Medicine | Admitting: Emergency Medicine

## 2023-08-19 ENCOUNTER — Encounter (HOSPITAL_BASED_OUTPATIENT_CLINIC_OR_DEPARTMENT_OTHER): Payer: Self-pay | Admitting: Emergency Medicine

## 2023-08-19 ENCOUNTER — Emergency Department (HOSPITAL_BASED_OUTPATIENT_CLINIC_OR_DEPARTMENT_OTHER): Payer: Self-pay

## 2023-08-19 ENCOUNTER — Other Ambulatory Visit: Payer: Self-pay

## 2023-08-19 DIAGNOSIS — L03116 Cellulitis of left lower limb: Secondary | ICD-10-CM

## 2023-08-19 DIAGNOSIS — B353 Tinea pedis: Secondary | ICD-10-CM | POA: Insufficient documentation

## 2023-08-19 MED ORDER — CICLOPIROX OLAMINE 0.77 % EX CREA: TOPICAL_CREAM | Freq: Two times a day (BID) | CUTANEOUS | 0 refills | Status: DC

## 2023-08-19 MED ORDER — DOXYCYCLINE HYCLATE 100 MG PO CAPS: 100.0000 mg | ORAL_CAPSULE | Freq: Two times a day (BID) | ORAL | 0 refills | Status: AC

## 2023-08-19 MED ORDER — CICLOPIROX OLAMINE 0.77 % EX CREA: TOPICAL_CREAM | Freq: Two times a day (BID) | CUTANEOUS | 0 refills | Status: AC

## 2023-08-19 MED ORDER — DOXYCYCLINE HYCLATE 100 MG PO CAPS
100.0000 mg | ORAL_CAPSULE | Freq: Two times a day (BID) | ORAL | 0 refills | Status: DC
Start: 2023-08-19 — End: 2023-08-19

## 2023-08-19 MED ORDER — ACETAMINOPHEN 500 MG PO TABS
1000.0000 mg | ORAL_TABLET | Freq: Once | ORAL | Status: AC
  Administered 2023-08-19: 1000 mg via ORAL
  Filled 2023-08-19: qty 2

## 2023-08-19 NOTE — ED Notes (Signed)
 Discharge instructions reviewed with patient. Patient verbalizes understanding, no further questions at this time. Medications/prescriptions and follow up information provided. No acute distress noted at time of departure.

## 2023-08-19 NOTE — ED Triage Notes (Signed)
 Pt with LT foot pain and swelling since yesterday; no injury

## 2023-08-19 NOTE — ED Provider Notes (Signed)
 [place] EMERGENCY DEPARTMENT AT [facility] Provider Note   CSN: [id number] Arrival date & time: 08/19/23  1135     History  Chief Complaint  Patient presents with   Foot Pain    Audrey Armstrong is a 38 y.o. female who presents with left lateral foot pain over the last 24 to 36 hours.  States that pain is increased with weightbearing, denies having any recent injuries, states that there is a reddened area to the outside of her left foot and that initially began with persistent aching sensation in the left lateral foot which progressed to inability to bear weight without increased pain.  States she went to work this morning, presented to the ED because she was unable to tolerate walking around while she was at work.  Has previously had cellulitis, states that the pain is consistent with her previous presentation.   Foot Pain       Home Medications Prior to Admission medications   Medication Sig Start Date End Date Taking? Authorizing Provider  ciclopirox (LOPROX) 0.77 % cream Apply topically 2 (two) times daily. 08/19/23  Yes Juanetta Nordmann, PA  acetaminophen  (TYLENOL ) 500 MG tablet Take 1 tablet (500 mg total) by mouth every 6 (six) hours as needed for fever (chills, body aches). 03/02/19   Jhonnie Mosher, PA-C  omeprazole  (PRILOSEC) 20 MG capsule Take 1 capsule (20 mg total) by mouth daily. 10/10/22   Almond Army, MD  sucralfate  (CARAFATE ) 1 g tablet Take 1 tablet (1 g total) by mouth 4 (four) times daily -  with meals and at bedtime. 10/10/22   Almond Army, MD      Allergies    Patient has no known allergies.    Review of Systems   Review of Systems  Musculoskeletal:  Positive for arthralgias. Negative for joint swelling.  All other systems reviewed and are negative.   Physical Exam Updated Vital Signs BP 132/78 (BP Location: Left Arm)   Pulse (!) 116   Temp 99.6 F (37.6 C) (Oral)   Resp 16   Ht 5\' 5"  (1.651 m)   SpO2 99%   BMI  42.43 kg/m  Physical Exam Vitals and nursing note reviewed.  Constitutional:      General: She is not in acute distress.    Appearance: Normal appearance.  HENT:     Head: Normocephalic and atraumatic.     Mouth/Throat:     Mouth: Mucous membranes are moist.     Pharynx: Oropharynx is clear.  Eyes:     Extraocular Movements: Extraocular movements intact.     Conjunctiva/sclera: Conjunctivae normal.     Pupils: Pupils are equal, round, and reactive to light.  Cardiovascular:     Rate and Rhythm: Normal rate and regular rhythm.     Pulses: Normal pulses.          Dorsalis pedis pulses are 2+ on the right side and 2+ on the left side.       Posterior tibial pulses are 2+ on the right side and 2+ on the left side.     Heart sounds: Normal heart sounds. No murmur heard.    No friction rub. No gallop.  Pulmonary:     Effort: Pulmonary effort is normal.     Breath sounds: Normal breath sounds.  Abdominal:     General: Abdomen is flat. Bowel sounds are normal.     Palpations: Abdomen is soft.  Musculoskeletal:        General:  Swelling and tenderness present. Normal range of motion.     Cervical back: Normal range of motion and neck supple.     Right lower leg: No edema.     Left lower leg: No edema.     Left foot: Swelling and tenderness present.     Comments: Tenderness to palpation of the dorsum of the left lateral foot, also noted erythematous patch on the lateral aspect of the left foot.    Feet:     Right foot:     Toenail Condition: Fungal disease present.    Left foot:     Toenail Condition: Fungal disease present.    Comments: Anterior digital white discharge noted along with xerotic patches noted to the dorsum of the bilateral feet. Skin:    General: Skin is warm and dry.     Capillary Refill: Capillary refill takes less than 2 seconds.  Neurological:     General: No focal deficit present.     Mental Status: She is alert. Mental status is at baseline.  Psychiatric:         Mood and Affect: Mood normal.     ED Results / Procedures / Treatments   Labs (all labs ordered are listed, but only abnormal results are displayed) Labs Reviewed - No data to display  EKG None  Radiology DG Foot Complete Left Result Date: 08/19/2023 CLINICAL DATA:  Acute left foot pain and swelling without known injury. EXAM: LEFT FOOT - COMPLETE 3+ VIEW COMPARISON:  April 28, 2020. FINDINGS: There is no evidence of fracture or dislocation. There is no evidence of arthropathy or other focal bone abnormality. Soft tissues are unremarkable. IMPRESSION: Negative. Electronically Signed   By: Rosalene Colon M.D.   On: 08/19/2023 12:34    Procedures Procedures    Medications Ordered in ED Medications  acetaminophen  (TYLENOL ) tablet 1,000 mg (1,000 mg Oral Given 08/19/23 1218)    ED Course/ Medical Decision Making/ A&P                                 Medical Decision Making Amount and/or Complexity of Data Reviewed Radiology: ordered.  Risk OTC drugs. Prescription drug management.   Medical Decision Making:   Audrey Armstrong is a 38 y.o. female who presented to the ED today with pain and swelling to the left lateral foot detailed above.     Complete initial physical exam performed, notably the patient  was alert and oriented in no apparent distress.  Noted an erythematous patch to the lateral aspect of the left foot along with tenderness to palpation of the same area.  At the interdigital spaces of both feet there is a xerotic plaque along with white discharge noted.     Reviewed and confirmed nursing documentation for past medical history, family history, social history.    Initial Assessment:   With the patient's presentation of swelling and pain to the lateral aspect the left foot, most likely diagnosis is cellulitis of the left lateral foot. Other diagnoses were considered including (but not limited to) gout or other inflammatory condition, bony injury to the  left foot.. These are considered less likely due to history of present illness and physical exam findings.     Initial Plan:  Obtain plain film imaging of the left foot Objective evaluation as below reviewed   Initial Study Results:    Radiology:  All images reviewed independently. Agree with radiology  report at this time.   DG Foot Complete Left Result Date: 08/19/2023 CLINICAL DATA:  Acute left foot pain and swelling without known injury. EXAM: LEFT FOOT - COMPLETE 3+ VIEW COMPARISON:  April 28, 2020. FINDINGS: There is no evidence of fracture or dislocation. There is no evidence of arthropathy or other focal bone abnormality. Soft tissues are unremarkable. IMPRESSION: Negative. Electronically Signed   By: Rosalene Colon M.D.   On: 08/19/2023 12:34    .   Reassessment and Plan:   Findings on the forefoot and interdigital spaces consistent with tinea pedis.  Will prescribe outpatient course of ciclopirox topically to be used until resolution.  Follow-up primary care for monitoring of progression within 1 week.  With clinical exam and review of x-ray did not show any bony abnormalities, believe symptoms are consistent with cellulitis of the left foot at the lateral aspect.  Will manage this with outpatient course of doxycycline.  Again encouraged follow-up within 1 week to primary care for monitoring of progression.  Care plan discussed with patient, she understands and agrees and has no further concerns at this time.            Final Clinical Impression(s) / ED Diagnoses Final diagnoses:  Tinea pedis of both feet    Rx / DC Orders ED Discharge Orders          Ordered    ciclopirox (LOPROX) 0.77 % cream  2 times daily        08/19/23 1216              Juanetta Nordmann, [place] 08/19/23 1239    Lowery Rue, DO 08/19/23 1311
